# Patient Record
Sex: Male | Born: 1970 | Race: White | Hispanic: No | Marital: Single | Smoking: Never smoker
Health system: Southern US, Community
[De-identification: ages and names within clinical notes are randomized; demographics above are authoritative.]

## PROBLEM LIST (undated history)

## (undated) DIAGNOSIS — F329 Major depressive disorder, single episode, unspecified: Secondary | ICD-10-CM

## (undated) DIAGNOSIS — F32A Depression, unspecified: Secondary | ICD-10-CM

## (undated) DIAGNOSIS — I10 Essential (primary) hypertension: Secondary | ICD-10-CM

## (undated) DIAGNOSIS — G4700 Insomnia, unspecified: Secondary | ICD-10-CM

## (undated) HISTORY — DX: Major depressive disorder, single episode, unspecified: F32.9

## (undated) HISTORY — PX: APPENDECTOMY: SHX54

## (undated) HISTORY — DX: Depression, unspecified: F32.A

## (undated) HISTORY — DX: Essential (primary) hypertension: I10

## (undated) HISTORY — DX: Insomnia, unspecified: G47.00

---

## 2010-08-16 ENCOUNTER — Emergency Department (HOSPITAL_COMMUNITY): Payer: 59

## 2010-08-16 ENCOUNTER — Emergency Department (HOSPITAL_COMMUNITY)
Admission: EM | Admit: 2010-08-16 | Discharge: 2010-08-16 | Disposition: A | Discharge status: Home / Self Care | Payer: 59 | Attending: Emergency Medicine | Admitting: Emergency Medicine

## 2010-08-16 DIAGNOSIS — X58XXXA Exposure to other specified factors, initial encounter: Secondary | ICD-10-CM | POA: Insufficient documentation

## 2010-08-16 DIAGNOSIS — R55 Syncope and collapse: Secondary | ICD-10-CM | POA: Insufficient documentation

## 2010-08-16 DIAGNOSIS — IMO0002 Reserved for concepts with insufficient information to code with codable children: Secondary | ICD-10-CM | POA: Insufficient documentation

## 2010-08-16 DIAGNOSIS — I10 Essential (primary) hypertension: Secondary | ICD-10-CM | POA: Insufficient documentation

## 2010-08-16 DIAGNOSIS — Y92009 Unspecified place in unspecified non-institutional (private) residence as the place of occurrence of the external cause: Secondary | ICD-10-CM | POA: Insufficient documentation

## 2010-08-16 DIAGNOSIS — F1010 Alcohol abuse, uncomplicated: Secondary | ICD-10-CM | POA: Insufficient documentation

## 2010-08-16 LAB — POCT I-STAT, CHEM 8
BUN: 9 mg/dL (ref 6–23)
Chloride: 101 mEq/L (ref 96–112)
Creatinine, Ser: 1.7 mg/dL — ABNORMAL HIGH (ref 0.4–1.5)
Glucose, Bld: 120 mg/dL — ABNORMAL HIGH (ref 70–99)
Potassium: 4.9 mEq/L (ref 3.5–5.1)

## 2010-08-16 LAB — URINALYSIS, ROUTINE W REFLEX MICROSCOPIC
Bilirubin Urine: NEGATIVE
Ketones, ur: NEGATIVE mg/dL
Leukocytes, UA: NEGATIVE
Specific Gravity, Urine: 1.012 (ref 1.005–1.030)
Urine Glucose, Fasting: NEGATIVE mg/dL
pH: 6 (ref 5.0–8.0)

## 2010-08-16 LAB — CBC
HCT: 48.4 % (ref 39.0–52.0)
Hemoglobin: 16.6 g/dL (ref 13.0–17.0)
MCHC: 34.3 g/dL (ref 30.0–36.0)
RBC: 4.57 MIL/uL (ref 4.22–5.81)
WBC: 7.2 10*3/uL (ref 4.0–10.5)

## 2010-08-16 LAB — URINE MICROSCOPIC-ADD ON

## 2010-08-16 LAB — COMPREHENSIVE METABOLIC PANEL
ALT: 60 U/L — ABNORMAL HIGH (ref 0–53)
AST: 41 U/L — ABNORMAL HIGH (ref 0–37)
Calcium: 9 mg/dL (ref 8.4–10.5)
Creatinine, Ser: 1.24 mg/dL (ref 0.4–1.5)
GFR calc Af Amer: >60 mL/min (ref >60)
Sodium: 141 mEq/L (ref 135–145)
Total Protein: 7 g/dL (ref 6.0–8.3)

## 2010-08-16 LAB — DIFFERENTIAL
Basophils Absolute: 0 10*3/uL (ref 0.0–0.1)
Lymphocytes Relative: 17 % (ref 12–46)
Monocytes Absolute: 0.5 10*3/uL (ref 0.1–1.0)
Neutro Abs: 5.4 10*3/uL (ref 1.7–7.7)

## 2010-08-16 LAB — RAPID URINE DRUG SCREEN, HOSP PERFORMED
Cocaine: NOT DETECTED
Tetrahydrocannabinol: NOT DETECTED

## 2010-08-16 LAB — POCT CARDIAC MARKERS
CKMB, poc: <1 ng/mL — ABNORMAL LOW (ref 1.0–8.0)
Troponin i, poc: <0.05 ng/mL (ref 0.00–0.09)

## 2015-12-23 ENCOUNTER — Encounter: Payer: Self-pay | Admitting: Internal Medicine

## 2016-01-15 ENCOUNTER — Encounter (INDEPENDENT_AMBULATORY_CARE_PROVIDER_SITE_OTHER): Payer: Self-pay

## 2016-01-15 ENCOUNTER — Encounter: Payer: Self-pay | Admitting: Gastroenterology

## 2016-01-15 ENCOUNTER — Ambulatory Visit (INDEPENDENT_AMBULATORY_CARE_PROVIDER_SITE_OTHER): Payer: BLUE CROSS/BLUE SHIELD | Admitting: Gastroenterology

## 2016-01-15 VITALS — BP 117/75 | HR 59 | Temp 98.6°F | Ht 73.0 in | Wt 195.4 lb

## 2016-01-15 DIAGNOSIS — R7989 Other specified abnormal findings of blood chemistry: Secondary | ICD-10-CM | POA: Diagnosis not present

## 2016-01-15 DIAGNOSIS — R945 Abnormal results of liver function studies: Principal | ICD-10-CM

## 2016-01-15 LAB — FERRITIN: Ferritin: 2871 ng/mL — ABNORMAL HIGH (ref 20–380)

## 2016-01-15 LAB — PROTIME-INR
INR: 1
Prothrombin Time: 10.8 s (ref 9.0–11.5)

## 2016-01-15 LAB — IRON AND TIBC
%SAT: 54 % (ref 15–60)
IRON: 171 ug/dL (ref 50–180)
TIBC: 317 ug/dL (ref 250–425)
UIBC: 146 ug/dL (ref 125–400)

## 2016-01-15 NOTE — Assessment & Plan Note (Signed)
45 year old male with elevated transaminases in the setting of routine moderate ETOH use. Viral markers negative. Will need to pursue evaluation with ultrasound of abdomen and further serologies to rule out any other underlying etiology. Likely a fatty liver component +/- ETOH. Recommend ETOH abstinence. Further recommendations to follow.
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
## 2016-01-15 NOTE — Progress Notes (Signed)
CC'D TO PCP °

## 2016-01-15 NOTE — Patient Instructions (Signed)
Please completed blood work today.   I have ordered an ultrasound of your liver.   Please limit or abstain from alcohol.  Further recommendations to follow!

## 2016-01-16 LAB — MITOCHONDRIAL ANTIBODIES: Mitochondrial M2 Ab, IgG: <=20 Units (ref <=20.0)

## 2016-01-16 LAB — ANTI-SMOOTH MUSCLE ANTIBODY, IGG: Smooth Muscle Ab: 28 U — ABNORMAL HIGH (ref <20)

## 2016-01-16 LAB — ANA: ANA: NEGATIVE

## 2016-01-17 LAB — CERULOPLASMIN: Ceruloplasmin: 29 mg/dL (ref 18–36)

## 2016-01-20 ENCOUNTER — Ambulatory Visit (HOSPITAL_COMMUNITY)
Admission: RE | Admit: 2016-01-20 | Discharge: 2016-01-20 | Disposition: A | Discharge status: Home / Self Care | Payer: BLUE CROSS/BLUE SHIELD | Source: Ambulatory Visit | Attending: Gastroenterology | Admitting: Gastroenterology

## 2016-01-20 DIAGNOSIS — R933 Abnormal findings on diagnostic imaging of other parts of digestive tract: Secondary | ICD-10-CM | POA: Diagnosis not present

## 2016-01-20 DIAGNOSIS — R7989 Other specified abnormal findings of blood chemistry: Secondary | ICD-10-CM | POA: Insufficient documentation

## 2016-01-20 DIAGNOSIS — R945 Abnormal results of liver function studies: Secondary | ICD-10-CM

## 2016-01-22 NOTE — Progress Notes (Signed)
Quick Note:  US abdomen notes sludge within gallbladder but borderline wall thickening, probable fatty liver. He is asymptomatic. Ferritin markedly elevated, iron sats elevated more than expected. Could be due to alcohol but we need to check hemochromatosis labs. ASMA also positive. Very important that he avoids alcohol completely. He may end up with a liver biopsy. Let's check hemochromatosis labs now. I have a low threshold for liver biopsy. ______

## 2016-01-24 ENCOUNTER — Other Ambulatory Visit: Payer: Self-pay

## 2016-01-24 ENCOUNTER — Other Ambulatory Visit: Payer: Self-pay | Admitting: Gastroenterology

## 2016-01-24 DIAGNOSIS — R7989 Other specified abnormal findings of blood chemistry: Secondary | ICD-10-CM

## 2016-02-20 LAB — HEMOCHROMATOSIS DNA-PCR(C282Y,H63D)

## 2016-03-17 NOTE — Progress Notes (Signed)
Likely negative for hemochromatosis. With his markedly elevated ferritin and +ASMA, would recommend liver biopsy to further sort out. If he is willing, recommend a transjugular liver biopsy with wedge measurements to assess for portal hypertension. Then, have him follow-up with me.

## 2016-03-23 ENCOUNTER — Encounter: Payer: Self-pay | Admitting: Internal Medicine

## 2016-03-23 NOTE — Progress Notes (Signed)
Noted. Would offer him routine office visit for follow-up.

## 2016-03-23 NOTE — Progress Notes (Signed)
APPT MADE AND LETTER SENT  °

## 2016-04-23 ENCOUNTER — Ambulatory Visit: Payer: BLUE CROSS/BLUE SHIELD | Admitting: Gastroenterology

## 2019-03-17 ENCOUNTER — Emergency Department (HOSPITAL_COMMUNITY): Payer: Self-pay

## 2019-03-17 ENCOUNTER — Encounter (HOSPITAL_COMMUNITY): Payer: Self-pay | Admitting: Emergency Medicine

## 2019-03-17 ENCOUNTER — Inpatient Hospital Stay (HOSPITAL_COMMUNITY)
Admission: EM | Admit: 2019-03-17 | Discharge: 2019-03-31 | DRG: 918 | Disposition: A | Discharge status: Home / Self Care | Payer: Self-pay | Attending: Internal Medicine | Admitting: Internal Medicine

## 2019-03-17 ENCOUNTER — Other Ambulatory Visit: Payer: Self-pay

## 2019-03-17 DIAGNOSIS — W19XXXA Unspecified fall, initial encounter: Secondary | ICD-10-CM

## 2019-03-17 DIAGNOSIS — Z915 Personal history of self-harm: Secondary | ICD-10-CM

## 2019-03-17 DIAGNOSIS — R292 Abnormal reflex: Secondary | ICD-10-CM | POA: Diagnosis present

## 2019-03-17 DIAGNOSIS — F332 Major depressive disorder, recurrent severe without psychotic features: Secondary | ICD-10-CM | POA: Insufficient documentation

## 2019-03-17 DIAGNOSIS — M4802 Spinal stenosis, cervical region: Secondary | ICD-10-CM | POA: Diagnosis present

## 2019-03-17 DIAGNOSIS — F331 Major depressive disorder, recurrent, moderate: Secondary | ICD-10-CM

## 2019-03-17 DIAGNOSIS — M6282 Rhabdomyolysis: Secondary | ICD-10-CM | POA: Diagnosis present

## 2019-03-17 DIAGNOSIS — H18603 Keratoconus, unspecified, bilateral: Secondary | ICD-10-CM | POA: Diagnosis present

## 2019-03-17 DIAGNOSIS — F1020 Alcohol dependence, uncomplicated: Secondary | ICD-10-CM

## 2019-03-17 DIAGNOSIS — Z7289 Other problems related to lifestyle: Secondary | ICD-10-CM

## 2019-03-17 DIAGNOSIS — S0010XA Contusion of unspecified eyelid and periocular area, initial encounter: Secondary | ICD-10-CM | POA: Diagnosis present

## 2019-03-17 DIAGNOSIS — S46812A Strain of other muscles, fascia and tendons at shoulder and upper arm level, left arm, initial encounter: Secondary | ICD-10-CM | POA: Diagnosis present

## 2019-03-17 DIAGNOSIS — W1812XA Fall from or off toilet with subsequent striking against object, initial encounter: Secondary | ICD-10-CM | POA: Diagnosis present

## 2019-03-17 DIAGNOSIS — Z20828 Contact with and (suspected) exposure to other viral communicable diseases: Secondary | ICD-10-CM | POA: Diagnosis present

## 2019-03-17 DIAGNOSIS — R778 Other specified abnormalities of plasma proteins: Secondary | ICD-10-CM

## 2019-03-17 DIAGNOSIS — H18891 Other specified disorders of cornea, right eye: Secondary | ICD-10-CM | POA: Diagnosis present

## 2019-03-17 DIAGNOSIS — D696 Thrombocytopenia, unspecified: Secondary | ICD-10-CM | POA: Diagnosis present

## 2019-03-17 DIAGNOSIS — Z560 Unemployment, unspecified: Secondary | ICD-10-CM

## 2019-03-17 DIAGNOSIS — Z653 Problems related to other legal circumstances: Secondary | ICD-10-CM

## 2019-03-17 DIAGNOSIS — Z79899 Other long term (current) drug therapy: Secondary | ICD-10-CM

## 2019-03-17 DIAGNOSIS — Z960 Presence of urogenital implants: Secondary | ICD-10-CM

## 2019-03-17 DIAGNOSIS — G4700 Insomnia, unspecified: Secondary | ICD-10-CM

## 2019-03-17 DIAGNOSIS — F339 Major depressive disorder, recurrent, unspecified: Secondary | ICD-10-CM

## 2019-03-17 DIAGNOSIS — G8324 Monoplegia of upper limb affecting left nondominant side: Secondary | ICD-10-CM | POA: Diagnosis present

## 2019-03-17 DIAGNOSIS — M549 Dorsalgia, unspecified: Secondary | ICD-10-CM

## 2019-03-17 DIAGNOSIS — M4712 Other spondylosis with myelopathy, cervical region: Secondary | ICD-10-CM | POA: Diagnosis present

## 2019-03-17 DIAGNOSIS — R509 Fever, unspecified: Secondary | ICD-10-CM

## 2019-03-17 DIAGNOSIS — T510X2A Toxic effect of ethanol, intentional self-harm, initial encounter: Secondary | ICD-10-CM | POA: Diagnosis present

## 2019-03-17 DIAGNOSIS — Z91048 Other nonmedicinal substance allergy status: Secondary | ICD-10-CM

## 2019-03-17 DIAGNOSIS — T43212A Poisoning by selective serotonin and norepinephrine reuptake inhibitors, intentional self-harm, initial encounter: Principal | ICD-10-CM | POA: Diagnosis present

## 2019-03-17 DIAGNOSIS — K5900 Constipation, unspecified: Secondary | ICD-10-CM | POA: Diagnosis present

## 2019-03-17 DIAGNOSIS — M5412 Radiculopathy, cervical region: Secondary | ICD-10-CM | POA: Diagnosis present

## 2019-03-17 DIAGNOSIS — H0289 Other specified disorders of eyelid: Secondary | ICD-10-CM

## 2019-03-17 DIAGNOSIS — T796XXA Traumatic ischemia of muscle, initial encounter: Secondary | ICD-10-CM

## 2019-03-17 DIAGNOSIS — M25412 Effusion, left shoulder: Secondary | ICD-10-CM | POA: Diagnosis present

## 2019-03-17 DIAGNOSIS — H1131 Conjunctival hemorrhage, right eye: Secondary | ICD-10-CM

## 2019-03-17 DIAGNOSIS — R55 Syncope and collapse: Secondary | ICD-10-CM

## 2019-03-17 DIAGNOSIS — R45851 Suicidal ideations: Secondary | ICD-10-CM | POA: Diagnosis present

## 2019-03-17 DIAGNOSIS — T1491XA Suicide attempt, initial encounter: Secondary | ICD-10-CM | POA: Diagnosis present

## 2019-03-17 DIAGNOSIS — Y92002 Bathroom of unspecified non-institutional (private) residence single-family (private) house as the place of occurrence of the external cause: Secondary | ICD-10-CM

## 2019-03-17 DIAGNOSIS — K760 Fatty (change of) liver, not elsewhere classified: Secondary | ICD-10-CM | POA: Diagnosis present

## 2019-03-17 DIAGNOSIS — S14129A Central cord syndrome at unspecified level of cervical spinal cord, initial encounter: Secondary | ICD-10-CM | POA: Diagnosis present

## 2019-03-17 DIAGNOSIS — S0511XA Contusion of eyeball and orbital tissues, right eye, initial encounter: Secondary | ICD-10-CM | POA: Diagnosis present

## 2019-03-17 DIAGNOSIS — I10 Essential (primary) hypertension: Secondary | ICD-10-CM

## 2019-03-17 DIAGNOSIS — R6510 Systemic inflammatory response syndrome (SIRS) of non-infectious origin without acute organ dysfunction: Secondary | ICD-10-CM

## 2019-03-17 DIAGNOSIS — N179 Acute kidney failure, unspecified: Secondary | ICD-10-CM | POA: Diagnosis present

## 2019-03-17 LAB — COMPREHENSIVE METABOLIC PANEL
ALT: 52 U/L — ABNORMAL HIGH (ref 0–44)
AST: 207 U/L — ABNORMAL HIGH (ref 15–41)
Albumin: 3.3 g/dL — ABNORMAL LOW (ref 3.5–5.0)
Alkaline Phosphatase: 39 U/L (ref 38–126)
Anion gap: 18 — ABNORMAL HIGH (ref 5–15)
BUN: 46 mg/dL — ABNORMAL HIGH (ref 6–20)
CO2: 25 mmol/L (ref 22–32)
Calcium: 8.6 mg/dL — ABNORMAL LOW (ref 8.9–10.3)
Chloride: 101 mmol/L (ref 98–111)
Creatinine, Ser: 2.38 mg/dL — ABNORMAL HIGH (ref 0.61–1.24)
GFR calc Af Amer: 37 mL/min — ABNORMAL LOW (ref >60)
GFR calc non Af Amer: 31 mL/min — ABNORMAL LOW (ref >60)
Glucose, Bld: 147 mg/dL — ABNORMAL HIGH (ref 70–99)
Potassium: 3.9 mmol/L (ref 3.5–5.1)
Sodium: 144 mmol/L (ref 135–145)
Total Bilirubin: 3.6 mg/dL — ABNORMAL HIGH (ref 0.3–1.2)
Total Protein: 6.8 g/dL (ref 6.5–8.1)

## 2019-03-17 LAB — I-STAT CHEM 8, ED
BUN: 50 mg/dL — ABNORMAL HIGH (ref 6–20)
Calcium, Ion: 1.01 mmol/L — ABNORMAL LOW (ref 1.15–1.40)
Chloride: 105 mmol/L (ref 98–111)
Creatinine, Ser: 2 mg/dL — ABNORMAL HIGH (ref 0.61–1.24)
Glucose, Bld: 138 mg/dL — ABNORMAL HIGH (ref 70–99)
HCT: 52 % (ref 39.0–52.0)
Hemoglobin: 17.7 g/dL — ABNORMAL HIGH (ref 13.0–17.0)
Potassium: 3.9 mmol/L (ref 3.5–5.1)
Sodium: 143 mmol/L (ref 135–145)
TCO2: 26 mmol/L (ref 22–32)

## 2019-03-17 LAB — SARS CORONAVIRUS 2 BY RT PCR (HOSPITAL ORDER, PERFORMED IN ~~LOC~~ HOSPITAL LAB): SARS Coronavirus 2: NEGATIVE

## 2019-03-17 LAB — URINALYSIS, ROUTINE W REFLEX MICROSCOPIC
Bilirubin Urine: NEGATIVE
Glucose, UA: NEGATIVE mg/dL
Ketones, ur: 20 mg/dL — AB
Leukocytes,Ua: NEGATIVE
Nitrite: NEGATIVE
Protein, ur: 100 mg/dL — AB
Specific Gravity, Urine: 1.027 (ref 1.005–1.030)
pH: 5 (ref 5.0–8.0)

## 2019-03-17 LAB — BASIC METABOLIC PANEL
Anion gap: 10 (ref 5–15)
BUN: 38 mg/dL — ABNORMAL HIGH (ref 6–20)
CO2: 21 mmol/L — ABNORMAL LOW (ref 22–32)
Calcium: 6.5 mg/dL — ABNORMAL LOW (ref 8.9–10.3)
Chloride: 114 mmol/L — ABNORMAL HIGH (ref 98–111)
Creatinine, Ser: 1.59 mg/dL — ABNORMAL HIGH (ref 0.61–1.24)
GFR calc Af Amer: 59 mL/min — ABNORMAL LOW (ref >60)
GFR calc non Af Amer: 51 mL/min — ABNORMAL LOW (ref >60)
Glucose, Bld: 115 mg/dL — ABNORMAL HIGH (ref 70–99)
Potassium: 3.1 mmol/L — ABNORMAL LOW (ref 3.5–5.1)
Sodium: 145 mmol/L (ref 135–145)

## 2019-03-17 LAB — TYPE AND SCREEN
ABO/RH(D): A POS
Antibody Screen: NEGATIVE

## 2019-03-17 LAB — POC OCCULT BLOOD, ED: Fecal Occult Bld: NEGATIVE

## 2019-03-17 LAB — PROTIME-INR
INR: 1 (ref 0.8–1.2)
INR: 1.3 — ABNORMAL HIGH (ref 0.8–1.2)
Prothrombin Time: 13.4 seconds (ref 11.4–15.2)
Prothrombin Time: 15.7 seconds — ABNORMAL HIGH (ref 11.4–15.2)

## 2019-03-17 LAB — CBC
HCT: 51.7 % (ref 39.0–52.0)
Hemoglobin: 17.7 g/dL — ABNORMAL HIGH (ref 13.0–17.0)
MCH: 38.7 pg — ABNORMAL HIGH (ref 26.0–34.0)
MCHC: 34.2 g/dL (ref 30.0–36.0)
MCV: 113.1 fL — ABNORMAL HIGH (ref 80.0–100.0)
Platelets: 65 10*3/uL — ABNORMAL LOW (ref 150–400)
RBC: 4.57 MIL/uL (ref 4.22–5.81)
RDW: 13 % (ref 11.5–15.5)
WBC: 7 10*3/uL (ref 4.0–10.5)
nRBC: 0 % (ref 0.0–0.2)

## 2019-03-17 LAB — APTT
aPTT: 24 seconds (ref 24–36)
aPTT: 24 seconds (ref 24–36)

## 2019-03-17 LAB — ABO/RH: ABO/RH(D): A POS

## 2019-03-17 LAB — MAGNESIUM: Magnesium: 1.9 mg/dL (ref 1.7–2.4)

## 2019-03-17 LAB — CK TOTAL AND CKMB (NOT AT ARMC)
CK, MB: 13.6 ng/mL — ABNORMAL HIGH (ref 0.5–5.0)
Relative Index: 0.2 (ref 0.0–2.5)
Total CK: 8252 U/L — ABNORMAL HIGH (ref 49–397)

## 2019-03-17 LAB — TROPONIN I (HIGH SENSITIVITY)
Troponin I (High Sensitivity): 91 ng/L — ABNORMAL HIGH (ref <18)
Troponin I (High Sensitivity): 85 ng/L — ABNORMAL HIGH (ref <18)

## 2019-03-17 LAB — CDS SEROLOGY

## 2019-03-17 LAB — PHOSPHORUS: Phosphorus: 3.3 mg/dL (ref 2.5–4.6)

## 2019-03-17 LAB — LACTIC ACID, PLASMA
Lactic Acid, Venous: 2.8 mmol/L (ref 0.5–1.9)
Lactic Acid, Venous: 1.4 mmol/L (ref 0.5–1.9)

## 2019-03-17 LAB — ETHANOL: Alcohol, Ethyl (B): <10 mg/dL (ref <10)

## 2019-03-17 MED ORDER — TETANUS-DIPHTH-ACELL PERTUSSIS 5-2.5-18.5 LF-MCG/0.5 IM SUSP
0.5000 mL | Freq: Once | INTRAMUSCULAR | Status: AC
  Administered 2019-03-17: 16:00:00 0.5 mL via INTRAMUSCULAR
  Filled 2019-03-17: qty 0.5

## 2019-03-17 MED ORDER — AMLODIPINE BESYLATE 5 MG PO TABS
10.0000 mg | ORAL_TABLET | Freq: Every day | ORAL | Status: DC
  Administered 2019-03-17: 19:00:00 10 mg via ORAL
  Filled 2019-03-17: qty 2

## 2019-03-17 MED ORDER — SODIUM CHLORIDE 0.9 % IV BOLUS
1000.0000 mL | Freq: Once | INTRAVENOUS | Status: AC
  Administered 2019-03-17: 16:00:00 1000 mL via INTRAVENOUS

## 2019-03-17 MED ORDER — SODIUM CHLORIDE 0.9 % IV SOLN
2.0000 g | Freq: Once | INTRAVENOUS | Status: AC
  Administered 2019-03-17: 16:00:00 2 g via INTRAVENOUS
  Filled 2019-03-17: qty 2

## 2019-03-17 MED ORDER — LORAZEPAM 2 MG/ML IJ SOLN: 0.0000 mg | Freq: Four times a day (QID) | INTRAMUSCULAR | Status: DC

## 2019-03-17 MED ORDER — VANCOMYCIN HCL 10 G IV SOLR: 1500.0000 mg | INTRAVENOUS | Status: DC

## 2019-03-17 MED ORDER — VITAMIN B-1 100 MG PO TABS
100.0000 mg | ORAL_TABLET | Freq: Every day | ORAL | Status: DC
  Administered 2019-03-17 – 2019-03-31 (×15): 100 mg via ORAL
  Filled 2019-03-17 (×15): qty 1

## 2019-03-17 MED ORDER — IOHEXOL 300 MG/ML  SOLN
80.0000 mL | Freq: Once | INTRAMUSCULAR | Status: AC | PRN
  Administered 2019-03-17: 80 mL via INTRAVENOUS

## 2019-03-17 MED ORDER — AMLODIPINE BESY-BENAZEPRIL HCL 10-40 MG PO CAPS: 1.0000 | ORAL_CAPSULE | Freq: Every day | ORAL | Status: DC

## 2019-03-17 MED ORDER — FOLIC ACID 1 MG PO TABS
1.0000 mg | ORAL_TABLET | Freq: Every day | ORAL | Status: DC
  Administered 2019-03-17 – 2019-03-31 (×15): 1 mg via ORAL
  Filled 2019-03-17 (×15): qty 1

## 2019-03-17 MED ORDER — VANCOMYCIN HCL 10 G IV SOLR
2500.0000 mg | Freq: Once | INTRAVENOUS | Status: AC
  Administered 2019-03-17: 2500 mg via INTRAVENOUS
  Filled 2019-03-17: qty 2500

## 2019-03-17 MED ORDER — METRONIDAZOLE IN NACL 5-0.79 MG/ML-% IV SOLN
500.0000 mg | Freq: Once | INTRAVENOUS | Status: AC
  Administered 2019-03-17: 500 mg via INTRAVENOUS
  Filled 2019-03-17: qty 100

## 2019-03-17 MED ORDER — PROPRANOLOL HCL ER BEADS 80 MG PO CP24: 80.0000 mg | ORAL_CAPSULE | Freq: Every day | ORAL | Status: DC

## 2019-03-17 MED ORDER — LABETALOL HCL 5 MG/ML IV SOLN: 5.0000 mg | Freq: Once | INTRAVENOUS | Status: DC | PRN

## 2019-03-17 MED ORDER — LORAZEPAM 1 MG PO TABS: 1.0000 mg | ORAL_TABLET | Freq: Four times a day (QID) | ORAL | Status: AC | PRN

## 2019-03-17 MED ORDER — LORAZEPAM 2 MG/ML IJ SOLN
0.5000 mg | Freq: Once | INTRAMUSCULAR | Status: AC
  Administered 2019-03-17: 19:00:00 0.5 mg via INTRAVENOUS
  Filled 2019-03-17: qty 1

## 2019-03-17 MED ORDER — SODIUM CHLORIDE 0.9 % IV BOLUS
1000.0000 mL | Freq: Once | INTRAVENOUS | Status: AC
  Administered 2019-03-17: 1000 mL via INTRAVENOUS

## 2019-03-17 MED ORDER — VANCOMYCIN HCL IN DEXTROSE 1-5 GM/200ML-% IV SOLN: 1000.0000 mg | Freq: Once | INTRAVENOUS | Status: DC

## 2019-03-17 MED ORDER — SODIUM CHLORIDE 0.9 % IV SOLN
INTRAVENOUS | Status: DC
  Administered 2019-03-17 – 2019-03-22 (×18): via INTRAVENOUS

## 2019-03-17 MED ORDER — THIAMINE HCL 100 MG/ML IJ SOLN
100.0000 mg | Freq: Every day | INTRAMUSCULAR | Status: DC
  Administered 2019-03-17: 100 mg via INTRAVENOUS
  Filled 2019-03-17: qty 2

## 2019-03-17 MED ORDER — LORAZEPAM 2 MG/ML IJ SOLN: 0.0000 mg | Freq: Two times a day (BID) | INTRAMUSCULAR | Status: DC

## 2019-03-17 MED ORDER — VITAMIN B-1 100 MG PO TABS: 100.0000 mg | ORAL_TABLET | Freq: Every day | ORAL | Status: DC

## 2019-03-17 MED ORDER — LORAZEPAM 2 MG/ML IJ SOLN
1.0000 mg | Freq: Four times a day (QID) | INTRAMUSCULAR | Status: AC | PRN
  Administered 2019-03-20: 15:00:00 1 mg via INTRAVENOUS
  Filled 2019-03-17: qty 1

## 2019-03-17 MED ORDER — LORAZEPAM 1 MG PO TABS: 0.0000 mg | ORAL_TABLET | Freq: Four times a day (QID) | ORAL | Status: DC

## 2019-03-17 MED ORDER — THIAMINE HCL 100 MG/ML IJ SOLN: 100.0000 mg | Freq: Every day | INTRAMUSCULAR | Status: DC

## 2019-03-17 MED ORDER — AMLODIPINE BESYLATE 5 MG PO TABS: 5.0000 mg | ORAL_TABLET | Freq: Every day | ORAL | Status: DC

## 2019-03-17 MED ORDER — ADULT MULTIVITAMIN W/MINERALS CH
1.0000 | ORAL_TABLET | Freq: Every day | ORAL | Status: DC
  Administered 2019-03-17 – 2019-03-31 (×15): 1 via ORAL
  Filled 2019-03-17 (×15): qty 1

## 2019-03-17 MED ORDER — THIAMINE HCL 100 MG/ML IJ SOLN
100.0000 mg | Freq: Every day | INTRAMUSCULAR | Status: DC
  Filled 2019-03-17: qty 2

## 2019-03-17 MED ORDER — LORAZEPAM 1 MG PO TABS: 0.0000 mg | ORAL_TABLET | Freq: Two times a day (BID) | ORAL | Status: DC

## 2019-03-17 MED ORDER — SODIUM CHLORIDE 0.9 % IV SOLN: 2.0000 g | Freq: Two times a day (BID) | INTRAVENOUS | Status: DC
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
## 2019-03-17 NOTE — Progress Notes (Signed)
Attempted to get report nurse not available.

## 2019-03-17 NOTE — Progress Notes (Signed)
Pharmacy Antibiotic Note  Clayton Baldwin is a 48 y.o. male admitted on 03/17/2019 with sepsis.  Pharmacy has been consulted for vancomycin/cefepime dosing. Tmax 100.4, WBC wnl, LA 2.8. SCr 2.38 on admit.  Plan: Cefepime 2g IV x 1; then 2g IV q12h Vancomycin 2500mg  IV x1; then Vancomycin 1500 mg IV Q 24 hrs. Goal AUC 400-550. Expected AUC: 538 SCr used: 2.38 Flagyl 500mg  IV x 1 per EDP - f/u if to continue Monitor clinical progress, c/s, renal function F/u de-escalation plan/LOT, vancomycin levels as indicated   Height: 6' (182.9 cm) Weight: 215 lb (97.5 kg) IBW/kg (Calculated) : 77.6  Temp (24hrs), Avg:100.4 F (38 C), Min:100.4 F (38 C), Max:100.4 F (38 C)  No results for input(s): WBC, CREATININE, LATICACIDVEN, VANCOTROUGH, VANCOPEAK, VANCORANDOM, GENTTROUGH, GENTPEAK, GENTRANDOM, TOBRATROUGH, TOBRAPEAK, TOBRARND, AMIKACINPEAK, AMIKACINTROU, AMIKACIN in the last 168 hours.  CrCl cannot be calculated (Patient's most recent lab result is older than the maximum 21 days allowed.).    No Known Allergies  Antimicrobials this admission: 9/4 vancomycin >>  9/4 cefepime >>  9/4 flagyl x 1  Dose adjustments this admission:   Microbiology results:   Elicia Lamp, PharmD, BCPS Clinical Pharmacist 03/17/2019 3:37 PM

## 2019-03-17 NOTE — ED Notes (Signed)
Attempted report, 4E refused

## 2019-03-17 NOTE — ED Notes (Signed)
Patient transported to X-ray 

## 2019-03-17 NOTE — ED Provider Notes (Signed)
[facility] EMERGENCY DEPARTMENT Provider Note   CSN: [id number] Arrival date & time: 03/17/19  1513   History   Chief Complaint Chief Complaint  Patient presents with   Fall   HPI Clayton Baldwin is a 48 y.o. male with past medical significant for hypertension, depression who presents via EMS.  She states he has had diarrhea x1 week.  Patient states he was having a bowel movement on Tuesday when he fell forward.  He does not know if he had a syncopal event.  Patient states he was too weak to get up.  Family had not heard from him and called police for a welfare check.  Please found patient on floor in the prone position.  Patient was to generalized weakness, productive cough and chills over the last week.  No recent antibiotic use no recent travel.  No known COVID exposures.  Patient states he has noted some dark stools without bright red blood per rectum.  He does take NSAIDs approximately 1 every other day for chronic headaches.  Patient denies sudden onset thunderclap headache.  Patient admits to pain "everywhere.  He has multiple contusions and abrasions.  Its to abdominal pain which he described as cramping.  No prior history of CVA.  Denies alcohol use or drug use.  Denies anticoagulation.  History otained from patient, past medical records.  No interpreter was used.     HPI  Past Medical History:  Diagnosis Date   Depression    Hypertension    Insomnia     Patient Active Problem List   Diagnosis Date Noted   Elevated LFTs 01/15/2016    Past Surgical History:  Procedure Laterality Date   APPENDECTOMY          Home Medications    Prior to Admission medications   Medication Sig Start Date End Date Taking? Authorizing Provider  cetirizine (KLS ALLER-TEC) 10 MG tablet Take 10 mg by mouth daily.   Yes [provider]  ibuprofen (ADVIL) 200 MG tablet Take 400 mg by mouth every 6 (six) hours as needed for headache or mild pain.   Yes  [provider]  NON FORMULARY Take 1 capsule by mouth See admin instructions. [employer] [employer] Adult 50+ Softgels- Take 1 softgel by mouth once a day   Yes [provider]  traZODone (DESYREL) 150 MG tablet Take 150 mg by mouth at bedtime as needed for sleep.    Yes [provider]  allopurinol (ZYLOPRIM) 100 MG tablet Take 100 mg by mouth daily.    [provider]  amLODipine (NORVASC) 10 MG tablet Take 10 mg by mouth daily.    [provider]  escitalopram (LEXAPRO) 20 MG tablet Take 20 mg by mouth daily.    [provider]  propranolol (INNOPRAN XL) 80 MG 24 hr capsule Take 80 mg by mouth at bedtime.    [provider]    Family History Family History  Problem Relation Age of Onset   Liver disease Other    Colon cancer Neg Hx    Colon polyps Neg Hx     Social History Social History   Tobacco Use   Smoking status: Never Smoker   Tobacco comment: Never really smoked  Substance Use Topics   Alcohol use: Yes    Alcohol/week: 0.0 standard drinks    Comment: couple of beers about 4 times per week    Drug use: No     Allergies   Grass  extracts [gramineae pollens]   Review of Systems Review of Systems  Constitutional: Positive for activity change, appetite change and chills.  HENT: Negative.   Respiratory: Positive for cough and shortness of breath (intermittent). Negative for apnea, choking, chest tightness, wheezing and stridor.   Cardiovascular: Negative.   Gastrointestinal: Positive for abdominal pain, blood in stool, diarrhea, nausea and vomiting. Negative for anal bleeding, constipation and rectal pain.  Genitourinary: Negative.   Musculoskeletal: Positive for gait problem.  Skin: Positive for wound.  Neurological: Positive for tremors, weakness, light-headedness and headaches (Chronic). Negative for dizziness, syncope, facial asymmetry, speech difficulty and numbness.  All other systems  reviewed and are negative.   Physical Exam Updated Vital Signs BP (!) 171/109    Pulse 95    Temp 98 F (36.7 C) (Oral)    Resp 17    Ht 6' (1.829 m)    Wt 97.5 kg    SpO2 99%    BMI 29.16 kg/m   Physical Exam Vitals signs and nursing note reviewed. Exam conducted with a chaperone present.  Constitutional:      General: He is not in acute distress.    Appearance: He is well-developed. He is ill-appearing. He is not diaphoretic.  HENT:     Head: Abrasion present.     Jaw: There is normal jaw occlusion.      Comments: Hematoma to left superior surface of her overall ridge to right eye.  No lacerations.  Mild contusion.    Nose: Nose normal.     Mouth/Throat:     Comments: Mucous membranes very dry.  Patient with matted secretions to face and inside mouth.  Midline tongue.  No oral lesions. Eyes:     Pupils: Pupils are equal, round, and reactive to light.     Comments: Unable to open right eye. Injected sclera to left.  Crusted drainage, matting to bilateral eyelashes.  Pupils equal reactive to light to left. Unable to assess right eye secondary to swelling.  Neck:     Musculoskeletal: Normal range of motion and neck supple.     Comments: C-collar in place. Cardiovascular:     Rate and Rhythm: Normal rate and regular rhythm.     Pulses: Normal pulses.          Radial pulses are 2+ on the right side and 2+ on the left side.       Dorsalis pedis pulses are 2+ on the right side and 2+ on the left side.       Posterior tibial pulses are 2+ on the right side and 2+ on the left side.     Comments: No murmurs, rubs or gallops. Pulmonary:     Effort: Pulmonary effort is normal. No respiratory distress.     Breath sounds: Normal breath sounds and air entry.  Chest:     Comments: No crepitus, edema or step-offs. Abdominal:     General: Bowel sounds are normal. There is no distension.     Palpations: Abdomen is soft.     Tenderness: There is generalized abdominal tenderness.      Comments: Generalized tenderness palpation.  Patient with 2 overlying abrasions to upper abdominal wall.  Genitourinary:    Penis: Normal.   Musculoskeletal: Normal range of motion.     Comments: Minimally able to lift bilateral upper and lower extremities off the ground.  Multiple contusions or abrasions with skin tears to bilateral upper and lower extremities.  2+ DP, PT pulses bilaterally.  2+  radial pulses bilaterally.  Mild swelling to left elbow with full range of motion.  bilateral forearms nontender.  Wrist nontender and stable to palpation.  No shortening or rotation of legs.  No bony tenderness to humerus, forearms, wrists, femur, tib-fib.  No sick tenderness palpation or step-offs.  There is mild tenderness palpation to L3 however no crepitus, deformity or step-offs.  Skin:    General: Skin is warm and dry.     Comments: Contusions, abrasions of bilateral upper and lower extremities as well as to upper abdomen.  Neurological:     Mental Status: He is alert.     Comments: Patient alert and oriented person, place time and president. Cranial nerves II through XII grossly intact.    ED Treatments / Results  Labs (all labs ordered are listed, but only abnormal results are displayed) Labs Reviewed  COMPREHENSIVE METABOLIC PANEL - Abnormal; Notable for the following components:      Result Value   Glucose, Bld 147 (*)    BUN 46 (*)    Creatinine, Ser 2.38 (*)    Calcium 8.6 (*)    Albumin 3.3 (*)    AST 207 (*)    ALT 52 (*)    Total Bilirubin 3.6 (*)    GFR calc non Af Amer 31 (*)    GFR calc Af Amer 37 (*)    Anion gap 18 (*)    All other components within normal limits  CBC - Abnormal; Notable for the following components:   Hemoglobin 17.7 (*)    MCV 113.1 (*)    MCH 38.7 (*)    Platelets 65 (*)    All other components within normal limits  URINALYSIS, ROUTINE W REFLEX MICROSCOPIC - Abnormal; Notable for the following components:   Color, Urine AMBER (*)    APPearance  CLOUDY (*)    Hgb urine dipstick LARGE (*)    Ketones, ur 20 (*)    Protein, ur 100 (*)    Bacteria, UA RARE (*)    All other components within normal limits  LACTIC ACID, PLASMA - Abnormal; Notable for the following components:   Lactic Acid, Venous 2.8 (*)    All other components within normal limits  CK TOTAL AND CKMB (NOT AT [facility]) - Abnormal; Notable for the following components:   Total CK 8,252 (*)    CK, MB 13.6 (*)    All other components within normal limits  I-STAT CHEM 8, ED - Abnormal; Notable for the following components:   BUN 50 (*)    Creatinine, Ser 2.00 (*)    Glucose, Bld 138 (*)    Calcium, Ion 1.01 (*)    Hemoglobin 17.7 (*)    All other components within normal limits  TROPONIN I (HIGH SENSITIVITY) - Abnormal; Notable for the following components:   Troponin I (High Sensitivity) 91 (*)    All other components within normal limits  TROPONIN I (HIGH SENSITIVITY) - Abnormal; Notable for the following components:   Troponin I (High Sensitivity) 85 (*)    All other components within normal limits  CULTURE, BLOOD (ROUTINE X 2)  SARS CORONAVIRUS 2 (HOSPITAL ORDER, PERFORMED IN [facility])  CULTURE, BLOOD (ROUTINE X 2)  URINE CULTURE  ETHANOL  PROTIME-INR  APTT  CDS SEROLOGY  RAPID URINE DRUG SCREEN, HOSP PERFORMED  I-STAT CHEM 8, ED  POC OCCULT BLOOD, ED  TYPE AND SCREEN  ABO/RH    EKG EKG Interpretation  Date/Time:  Friday March 17 2019 16:42:37 EDT Ventricular Rate:  101 PR Interval:    QRS Duration: 76 QT Interval:  374 QTC Calculation: 485 R Axis:   48 Text Interpretation:  Sinus tachycardia Nonspecific T abnormalities, diffuse leads Borderline prolonged QT interval Confirmed by Kristine Royal [id number]) on 03/17/2019 5:45:34 PM   Radiology Dg Chest 1 View  Result Date: 03/17/2019 CLINICAL DATA:  Fall EXAM: CHEST  1 VIEW COMPARISON:  08/16/2010 FINDINGS: The heart size and mediastinal contours are within normal limits. Both lungs  are clear. The visualized skeletal structures are unremarkable. IMPRESSION: No active disease. Electronically Signed   By: Jasmine Pang M.D.   On: 03/17/2019 16:33   Dg Pelvis 1-2 Views  Result Date: 03/17/2019 CLINICAL DATA:  Fall EXAM: PELVIS - 1-2 VIEW COMPARISON:  None. FINDINGS: There is no evidence of pelvic fracture or diastasis. No pelvic bone lesions are seen. IMPRESSION: Negative. Electronically Signed   By: Jasmine Pang M.D.   On: 03/17/2019 16:33   Dg Elbow Complete Left  Result Date: 03/17/2019 CLINICAL DATA:  Fall with elbow abrasion EXAM: LEFT ELBOW - COMPLETE 3+ VIEW COMPARISON:  None. FINDINGS: No fracture or malalignment. No significant elbow effusion. Protuberant soft tissue swelling dorsal aspect of the proximal forearm. IMPRESSION: No acute osseous abnormality Electronically Signed   By: Jasmine Pang M.D.   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 Electronically Signed   By: Katherine Mantle M.D.   On: 03/17/2019 18:08   Result Date: 03/17/2019 CLINICAL DATA:  Acute pain due to trauma. Hematoma to the right eye. EXAM: CT HEAD WITHOUT CONTRAST CT MAXILLOFACIAL WITHOUT CONTRAST CT CERVICAL SPINE WITHOUT CONTRAST CT CHEST, ABDOMEN AND PELVIS WITH CONTRAST TECHNIQUE: Contiguous axial images were obtained from the base of the skull through the vertex without intravenous contrast. Multidetector CT imaging of the maxillofacial structures was performed. Multiplanar CT image reconstructions were also generated. A small metallic BB was placed on the right temple in order to reliably differentiate right from left. Multidetector CT imaging of the cervical spine was performed without intravenous contrast. Multiplanar CT image reconstructions were also generated. Multidetector CT imaging of the chest, abdomen and  pelvis was performed following the standard protocol during bolus administration of intravenous contrast. CONTRAST:  80mL OMNIPAQUE IOHEXOL 300 MG/ML  SOLN COMPARISON:  CT head dated 08/16/2010 FINDINGS: CT HEAD: Brain: No evidence of acute infarction, hemorrhage, hydrocephalus, extra-axial collection or mass lesion/mass effect. There is advanced atrophy, greater than expected for the patient's stated age. Vascular: No hyperdense vessel or unexpected calcification. Skull: Normal. Negative for fracture or focal lesion. There is right frontal scalp swelling. Other: None. CT MAXILLOFACIAL FINDINGS Osseous: No fracture or mandibular dislocation. No destructive process. Orbits: Negative. No traumatic or inflammatory finding. Sinuses: Clear. Soft tissues: There is right periorbital soft tissue swelling. CT CERVICAL SPINE FINDINGS Alignment: Normal. Skull base and vertebrae: No acute fracture. No primary bone lesion or focal pathologic process. 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 There are multilevel degenerative changes throughout the cervical spine, greatest at the C5-C6 level. There is osseous neural foraminal narrowing at the C4-C5 and C5-C6 levels bilaterally. Soft tissues and spinal canal: No prevertebral fluid or swelling. No visible canal hematoma. Disc levels: Multilevel disc height loss is noted throughout the cervical spine. Other: None. CT CHEST FINDINGS Cardiovascular: The thoracic  aorta is unremarkable. Coronary artery calcifications are noted. The heart size is normal. There is no significant pericardial effusion. Mediastinum/Nodes: --No mediastinal or hilar lymphadenopathy. --No axillary lymphadenopathy. --No supraclavicular lymphadenopathy. --Normal thyroid gland. --The esophagus is unremarkable Lungs/Pleura: There is some mild atelectasis at the lung bases and within the right middle lobe. There is no pneumothorax. No significant pleural effusion. Musculoskeletal: No chest wall  abnormality. No acute or significant osseous findings. CT ABDOMEN AND PELVIS FINDINGS Hepatobiliary: There is decreased hepatic attenuation suggestive of hepatic steatosis. Normal gallbladder.There is no biliary ductal dilation. Pancreas: Normal contours without ductal dilatation. No peripancreatic fluid collection. Spleen: No splenic laceration or hematoma. Adrenals/Urinary Tract: --Adrenal glands: No adrenal hemorrhage. 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 There are no radiopaque kidney stones. --Urinary bladder: There is a focus of gas within the urinary bladder. Stomach/Bowel: --Stomach/Duodenum: No hiatal hernia or other gastric abnormality. Normal duodenal course and caliber. --Small bowel: No dilatation or inflammation. --Colon: There is scattered colonic diverticula without CT evidence for diverticulitis. --Appendix: Not visualized. No right lower quadrant inflammation or free fluid. Vascular/Lymphatic: Normal course and caliber of the major abdominal vessels. --No retroperitoneal lymphadenopathy. --No mesenteric lymphadenopathy. --No pelvic or inguinal lymphadenopathy. Reproductive: Unremarkable Other: No ascites or free air. The abdominal wall is normal. Musculoskeletal. There appears to be some mild height loss of the T11 vertebral body, likely chronic. There is an age indeterminate fracture of the L3 transverse process on the right. This is favored to be chronic. IMPRESSION: 1. No acute intracranial process. 2. No acute cervical spine fracture or dislocation. 3. Right frontal scalp soft tissue swelling. 4. No acute maxillofacial fracture. 5. No acute intra-abdominal or pelvic injury. 6. Hepatic steatosis. 7. Horseshoe kidney. 8. Colonic diverticulosis without CT evidence for diverticulitis. 9. There is a chronic appearing L3 transverse process fracture on the right. 10. Focus of gas within the urinary bladder. Correlation with urinalysis and patient history is recommended.  This may be secondary to prior instrumentation or an infection with a gas-forming organism. Electronically Signed: By: Katherine Mantle M.D. 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 Electronically Signed   By: Katherine Mantle M.D.   On: 03/17/2019 18:08   Result Date: 03/17/2019 CLINICAL DATA:  Acute pain due to trauma. Hematoma to the right eye. EXAM: CT HEAD WITHOUT CONTRAST CT MAXILLOFACIAL WITHOUT CONTRAST CT CERVICAL SPINE WITHOUT CONTRAST CT CHEST, ABDOMEN AND PELVIS WITH CONTRAST TECHNIQUE: Contiguous axial images were obtained from the base of the skull through the vertex without intravenous contrast. Multidetector CT imaging of the maxillofacial structures was performed. Multiplanar CT image reconstructions were also generated. A small metallic BB was placed on the right temple in order to reliably differentiate right from left. Multidetector CT imaging of the cervical spine was performed without intravenous contrast. Multiplanar CT image reconstructions were also generated. Multidetector CT imaging of the chest, abdomen and pelvis was performed following the standard protocol during bolus administration of intravenous contrast. CONTRAST:  80mL OMNIPAQUE IOHEXOL 300 MG/ML  SOLN COMPARISON:  CT head dated 08/16/2010 FINDINGS: CT HEAD: Brain: No evidence of acute infarction, hemorrhage, hydrocephalus, extra-axial collection or mass lesion/mass effect. There is advanced atrophy, greater than expected for the patient's stated age. Vascular: No hyperdense vessel or unexpected calcification. Skull: Normal. Negative for fracture or focal lesion. There is right frontal scalp  swelling. Other: None. CT MAXILLOFACIAL FINDINGS Osseous: No fracture or mandibular dislocation. No destructive process. Orbits: Negative. No traumatic or  inflammatory finding. Sinuses: Clear. Soft tissues: There is right periorbital soft tissue swelling. CT CERVICAL SPINE FINDINGS Alignment: Normal. Skull base and vertebrae: No acute fracture. No primary bone lesion or focal pathologic process. 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 There are multilevel degenerative changes throughout the cervical spine, greatest at the C5-C6 level. There is osseous neural foraminal narrowing at the C4-C5 and C5-C6 levels bilaterally. Soft tissues and spinal canal: No prevertebral fluid or swelling. No visible canal hematoma. Disc levels: Multilevel disc height loss is noted throughout the cervical spine. Other: None. CT CHEST FINDINGS Cardiovascular: The thoracic aorta is unremarkable. Coronary artery calcifications are noted. The heart size is normal. There is no significant pericardial effusion. Mediastinum/Nodes: --No mediastinal or hilar lymphadenopathy. --No axillary lymphadenopathy. --No supraclavicular lymphadenopathy. --Normal thyroid gland. --The esophagus is unremarkable Lungs/Pleura: There is some mild atelectasis at the lung bases and within the right middle lobe. There is no pneumothorax. No significant pleural effusion. Musculoskeletal: No chest wall abnormality. No acute or significant osseous findings. CT ABDOMEN AND PELVIS FINDINGS Hepatobiliary: There is decreased hepatic attenuation suggestive of hepatic steatosis. Normal gallbladder.There is no biliary ductal dilation. Pancreas: Normal contours without ductal dilatation. No peripancreatic fluid collection. Spleen: No splenic laceration or hematoma. Adrenals/Urinary Tract: --Adrenal glands: No adrenal hemorrhage. 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 There are no radiopaque kidney stones. --Urinary bladder: There is a focus of gas within the urinary bladder. Stomach/Bowel: --Stomach/Duodenum: No hiatal hernia or other gastric abnormality. Normal duodenal course and  caliber. --Small bowel: No dilatation or inflammation. --Colon: There is scattered colonic diverticula without CT evidence for diverticulitis. --Appendix: Not visualized. No right lower quadrant inflammation or free fluid. Vascular/Lymphatic: Normal course and caliber of the major abdominal vessels. --No retroperitoneal lymphadenopathy. --No mesenteric lymphadenopathy. --No pelvic or inguinal lymphadenopathy. Reproductive: Unremarkable Other: No ascites or free air. The abdominal wall is normal. Musculoskeletal. There appears to be some mild height loss of the T11 vertebral body, likely chronic. There is an age indeterminate fracture of the L3 transverse process on the right. This is favored to be chronic. IMPRESSION: 1. No acute intracranial process. 2. No acute cervical spine fracture or dislocation. 3. Right frontal scalp soft tissue swelling. 4. No acute maxillofacial fracture. 5. No acute intra-abdominal or pelvic injury. 6. Hepatic steatosis. 7. Horseshoe kidney. 8. Colonic diverticulosis without CT evidence for diverticulitis. 9. There is a chronic appearing L3 transverse process fracture on the right. 10. Focus of gas within the urinary bladder. Correlation with urinalysis and patient history is recommended. This may be secondary to prior instrumentation or an infection with a gas-forming organism. Electronically Signed: By: Katherine Mantlehristopher  Green M.D. 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 Electronically Signed   By: Katherine Mantlehristopher  Green M.D.   On: 03/17/2019 18:08   Result Date: 03/17/2019 CLINICAL DATA:  Acute pain due to trauma. Hematoma to the right eye. EXAM: CT HEAD WITHOUT CONTRAST CT MAXILLOFACIAL WITHOUT CONTRAST CT CERVICAL SPINE WITHOUT CONTRAST CT CHEST, ABDOMEN AND PELVIS WITH CONTRAST TECHNIQUE: Contiguous  axial images were obtained from the base of the skull through the vertex without intravenous contrast. Multidetector CT imaging of the maxillofacial structures was performed. Multiplanar CT  image reconstructions were also generated. A small metallic BB was placed on the right temple in order to reliably differentiate right from left. Multidetector CT imaging of the cervical spine was performed without intravenous contrast. Multiplanar CT image reconstructions were also generated. Multidetector CT imaging of the chest, abdomen and pelvis was performed following the standard protocol during bolus administration of intravenous contrast. CONTRAST:  80mL OMNIPAQUE IOHEXOL 300 MG/ML  SOLN COMPARISON:  CT head dated 08/16/2010 FINDINGS: CT HEAD: Brain: No evidence of acute infarction, hemorrhage, hydrocephalus, extra-axial collection or mass lesion/mass effect. There is advanced atrophy, greater than expected for the patient's stated age. Vascular: No hyperdense vessel or unexpected calcification. Skull: Normal. Negative for fracture or focal lesion. There is right frontal scalp swelling. Other: None. CT MAXILLOFACIAL FINDINGS Osseous: No fracture or mandibular dislocation. No destructive process. Orbits: Negative. No traumatic or inflammatory finding. Sinuses: Clear. Soft tissues: There is right periorbital soft tissue swelling. CT CERVICAL SPINE FINDINGS Alignment: Normal. Skull base and vertebrae: No acute fracture. No primary bone lesion or focal pathologic process. 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 There are multilevel degenerative changes throughout the cervical spine, greatest at the C5-C6 level. There is osseous neural foraminal narrowing at the C4-C5 and C5-C6 levels bilaterally. Soft tissues and spinal canal: No prevertebral fluid or swelling. No visible canal hematoma. Disc levels: Multilevel disc height loss is noted throughout the cervical spine. Other: None. CT CHEST FINDINGS  Cardiovascular: The thoracic aorta is unremarkable. Coronary artery calcifications are noted. The heart size is normal. There is no significant pericardial effusion. Mediastinum/Nodes: --No mediastinal or hilar lymphadenopathy. --No axillary lymphadenopathy. --No supraclavicular lymphadenopathy. --Normal thyroid gland. --The esophagus is unremarkable Lungs/Pleura: There is some mild atelectasis at the lung bases and within the right middle lobe. There is no pneumothorax. No significant pleural effusion. Musculoskeletal: No chest wall abnormality. No acute or significant osseous findings. CT ABDOMEN AND PELVIS FINDINGS Hepatobiliary: There is decreased hepatic attenuation suggestive of hepatic steatosis. Normal gallbladder.There is no biliary ductal dilation. Pancreas: Normal contours without ductal dilatation. No peripancreatic fluid collection. Spleen: No splenic laceration or hematoma. Adrenals/Urinary Tract: --Adrenal glands: No adrenal hemorrhage. 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 There are no radiopaque kidney stones. --Urinary bladder: There is a focus of gas within the urinary bladder. Stomach/Bowel: --Stomach/Duodenum: No hiatal hernia or other gastric abnormality. Normal duodenal course and caliber. --Small bowel: No dilatation or inflammation. --Colon: There is scattered colonic diverticula without CT evidence for diverticulitis. --Appendix: Not visualized. No right lower quadrant inflammation or free fluid. Vascular/Lymphatic: Normal course and caliber of the major abdominal vessels. --No retroperitoneal lymphadenopathy. --No mesenteric lymphadenopathy. --No pelvic or inguinal lymphadenopathy. Reproductive: Unremarkable Other: No ascites or free air. The abdominal wall is normal. Musculoskeletal. There appears to be some mild height loss of the T11 vertebral body, likely chronic. There is an age indeterminate fracture of the L3 transverse process on the right. This is  favored to be chronic. IMPRESSION: 1. No acute intracranial process. 2. No acute cervical spine fracture or dislocation. 3. Right frontal scalp soft tissue swelling. 4. No acute maxillofacial fracture. 5. No acute intra-abdominal or pelvic injury. 6. Hepatic steatosis. 7. Horseshoe kidney. 8. Colonic diverticulosis without CT evidence for diverticulitis. 9. There is a chronic appearing L3 transverse process fracture on the right. 10. Focus of gas within the urinary bladder. Correlation with urinalysis and patient history is recommended. This may be secondary to prior instrumentation or an infection with a gas-forming  organism. Electronically Signed: By: Katherine Mantle M.D. 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 Electronically Signed   By: Katherine Mantle M.D.   On: 03/17/2019 18:08   Result Date: 03/17/2019 CLINICAL DATA:  Acute pain due to trauma. Hematoma to the right eye. EXAM: CT HEAD WITHOUT CONTRAST CT MAXILLOFACIAL WITHOUT CONTRAST CT CERVICAL SPINE WITHOUT CONTRAST CT CHEST, ABDOMEN AND PELVIS WITH CONTRAST TECHNIQUE: Contiguous axial images were obtained from the base of the skull through the vertex without intravenous contrast. Multidetector CT imaging of the maxillofacial structures was performed. Multiplanar CT image reconstructions were also generated. A small metallic BB was placed on the right temple in order to reliably differentiate right from left. Multidetector CT imaging of the cervical spine was performed without intravenous contrast. Multiplanar CT image reconstructions were also generated. Multidetector CT imaging of the chest, abdomen and pelvis was performed following the standard protocol during bolus administration of intravenous contrast. CONTRAST:  80mL OMNIPAQUE IOHEXOL 300 MG/ML  SOLN  COMPARISON:  CT head dated 08/16/2010 FINDINGS: CT HEAD: Brain: No evidence of acute infarction, hemorrhage, hydrocephalus, extra-axial collection or mass lesion/mass effect. There is advanced atrophy, greater than expected for the patient's stated age. Vascular: No hyperdense vessel or unexpected calcification. Skull: Normal. Negative for fracture or focal lesion. There is right frontal scalp swelling. Other: None. CT MAXILLOFACIAL FINDINGS Osseous: No fracture or mandibular dislocation. No destructive process. Orbits: Negative. No traumatic or inflammatory finding. Sinuses: Clear. Soft tissues: There is right periorbital soft tissue swelling. CT CERVICAL SPINE FINDINGS Alignment: Normal. Skull base and vertebrae: No acute fracture. No primary bone lesion or focal pathologic process. 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 There are multilevel degenerative changes throughout the cervical spine, greatest at the C5-C6 level. There is osseous neural foraminal narrowing at the C4-C5 and C5-C6 levels bilaterally. Soft tissues and spinal canal: No prevertebral fluid or swelling. No visible canal hematoma. Disc levels: Multilevel disc height loss is noted throughout the cervical spine. Other: None. CT CHEST FINDINGS Cardiovascular: The thoracic aorta is unremarkable. Coronary artery calcifications are noted. The heart size is normal. There is no significant pericardial effusion. Mediastinum/Nodes: --No mediastinal or hilar lymphadenopathy. --No axillary lymphadenopathy. --No supraclavicular lymphadenopathy. --Normal thyroid gland. --The esophagus is unremarkable Lungs/Pleura: There is some mild atelectasis at the lung bases and within the right middle lobe. There is no pneumothorax. No significant pleural effusion. Musculoskeletal: No chest wall abnormality. No acute or significant osseous findings. CT ABDOMEN AND PELVIS FINDINGS Hepatobiliary: There is decreased hepatic attenuation suggestive of hepatic  steatosis. Normal gallbladder.There is no biliary ductal dilation. Pancreas: Normal contours without ductal dilatation. No peripancreatic fluid collection. Spleen: No splenic laceration or hematoma. Adrenals/Urinary Tract: --Adrenal glands: No adrenal hemorrhage. 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 There are no radiopaque kidney stones. --Urinary bladder: There is a focus of gas within the urinary bladder. Stomach/Bowel: --Stomach/Duodenum: No hiatal hernia or other gastric abnormality. Normal duodenal course and caliber. --Small bowel: No dilatation or inflammation. --Colon: There is scattered colonic diverticula without CT evidence for diverticulitis. --Appendix: Not visualized. No right lower quadrant inflammation or free fluid. Vascular/Lymphatic: Normal course and caliber of the major abdominal vessels. --No retroperitoneal lymphadenopathy. --No mesenteric lymphadenopathy. --No pelvic or inguinal lymphadenopathy. Reproductive: Unremarkable Other: No ascites or free air. The abdominal wall is  normal. Musculoskeletal. There appears to be some mild height loss of the T11 vertebral body, likely chronic. There is an age indeterminate fracture of the L3 transverse process on the right. This is favored to be chronic. IMPRESSION: 1. No acute intracranial process. 2. No acute cervical spine fracture or dislocation. 3. Right frontal scalp soft tissue swelling. 4. No acute maxillofacial fracture. 5. No acute intra-abdominal or pelvic injury. 6. Hepatic steatosis. 7. Horseshoe kidney. 8. Colonic diverticulosis without CT evidence for diverticulitis. 9. There is a chronic appearing L3 transverse process fracture on the right. 10. Focus of gas within the urinary bladder. Correlation with urinalysis and patient history is recommended. This may be secondary to prior instrumentation or an infection with a gas-forming organism. Electronically Signed: By: Katherine Mantlehristopher  Green M.D. On: 03/17/2019 18:04    Ct L-spine No Charge  Result Date: 03/17/2019 CLINICAL DATA:  Back pain, minor trauma. Patient was found on the floor with sore on chest in left elbow and hematoma over the right eye. EXAM: CT LUMBAR SPINE WITHOUT CONTRAST TECHNIQUE: Multidetector CT imaging of the lumbar spine was performed without intravenous contrast administration. Multiplanar CT image reconstructions were also generated. COMPARISON:  None. FINDINGS: Segmentation: 5 non rib-bearing lumbar type vertebral bodies are present. The lowest fully formed vertebral body is L5. Alignment: There is some straightening of the normal lumbar lordosis. No significant listhesis is present. Vertebrae: Vertebral body heights are maintained. Well corticated fragment at the right transverse process of L3 is most consistent with remote trauma. There is no evidence for acute fracture. Paraspinal and other soft tissues: A horseshoe kidney is noted. Visualized paraspinous soft tissues are otherwise unremarkable. Disc levels: Broad-based disc protrusions are present at L3-4, L4-5, and L5-S1. Disc material extends into the foramina at each of these levels. This contributes to mild foraminal narrowing bilaterally. No other significant disc disease or stenosis is evident. IMPRESSION: 1. No acute abnormality. 2. Remote fracture involving the right transverse process of L3. 3. 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 Electronically Signed   By: Marin Robertshristopher  Mattern M.D.   On: 03/17/2019 17:50   Dg Knee Complete 4 Views Left  Result Date: 03/17/2019 CLINICAL DATA:  Fall EXAM: LEFT KNEE - COMPLETE 4+ VIEW COMPARISON:  None. FINDINGS: No fracture or malalignment. Minimal medial and patellofemoral degenerative change. No large knee effusion IMPRESSION: No acute osseous abnormality Electronically Signed   By: Jasmine PangKim  Fujinaga M.D.   On: 03/17/2019 16:32   Dg Knee Complete 4 Views Right  Result Date: 03/17/2019 CLINICAL DATA:  Larey SeatFell in bathroom EXAM: RIGHT KNEE -  COMPLETE 4+ VIEW COMPARISON:  None. FINDINGS: No fracture or malalignment. No large knee effusion. Joint spaces are relatively maintained IMPRESSION: No acute osseous abnormality Electronically Signed   By: Jasmine PangKim  Fujinaga M.D.   On: 03/17/2019 16:31   Ct Maxillofacial Wo Contrast  Addendum Date: 03/17/2019   ADDENDUM REPORT: 03/17/2019 18:08 ADDENDUM: There is intracranial atrophy that is significantly greater than expected for the patient's recorded age. Electronically Signed   By: Katherine Mantlehristopher  Green M.D.   On: 03/17/2019 18:08   Result Date: 03/17/2019 CLINICAL DATA:  Acute pain due to trauma. Hematoma to the right eye. EXAM: CT HEAD WITHOUT CONTRAST CT MAXILLOFACIAL WITHOUT CONTRAST CT CERVICAL SPINE WITHOUT CONTRAST CT CHEST, ABDOMEN AND PELVIS WITH CONTRAST TECHNIQUE: Contiguous axial images were obtained from the base of the skull through the vertex without intravenous contrast. Multidetector CT imaging of the maxillofacial structures was performed. Multiplanar CT image reconstructions were  also generated. A small metallic BB was placed on the right temple in order to reliably differentiate right from left. Multidetector CT imaging of the cervical spine was performed without intravenous contrast. Multiplanar CT image reconstructions were also generated. Multidetector CT imaging of the chest, abdomen and pelvis was performed following the standard protocol during bolus administration of intravenous contrast. CONTRAST:  80mL OMNIPAQUE IOHEXOL 300 MG/ML  SOLN COMPARISON:  CT head dated 08/16/2010 FINDINGS: CT HEAD: Brain: No evidence of acute infarction, hemorrhage, hydrocephalus, extra-axial collection or mass lesion/mass effect. There is advanced atrophy, greater than expected for the patient's stated age. Vascular: No hyperdense vessel or unexpected calcification. Skull: Normal. Negative for fracture or focal lesion. There is right frontal scalp swelling. Other: None. CT MAXILLOFACIAL FINDINGS Osseous: No  fracture or mandibular dislocation. No destructive process. Orbits: Negative. No traumatic or inflammatory finding. Sinuses: Clear. Soft tissues: There is right periorbital soft tissue swelling. CT CERVICAL SPINE FINDINGS Alignment: Normal. Skull base and vertebrae: No acute fracture. No primary bone lesion or focal pathologic process. 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 There are multilevel degenerative changes throughout the cervical spine, greatest at the C5-C6 level. There is osseous neural foraminal narrowing at the C4-C5 and C5-C6 levels bilaterally. Soft tissues and spinal canal: No prevertebral fluid or swelling. No visible canal hematoma. Disc levels: Multilevel disc height loss is noted throughout the cervical spine. Other: None. CT CHEST FINDINGS Cardiovascular: The thoracic aorta is unremarkable. Coronary artery calcifications are noted. The heart size is normal. There is no significant pericardial effusion. Mediastinum/Nodes: --No mediastinal or hilar lymphadenopathy. --No axillary lymphadenopathy. --No supraclavicular lymphadenopathy. --Normal thyroid gland. --The esophagus is unremarkable Lungs/Pleura: There is some mild atelectasis at the lung bases and within the right middle lobe. There is no pneumothorax. No significant pleural effusion. Musculoskeletal: No chest wall abnormality. No acute or significant osseous findings. CT ABDOMEN AND PELVIS FINDINGS Hepatobiliary: There is decreased hepatic attenuation suggestive of hepatic steatosis. Normal gallbladder.There is no biliary ductal dilation. Pancreas: Normal contours without ductal dilatation. No peripancreatic fluid collection. Spleen: No splenic laceration or hematoma. Adrenals/Urinary Tract: --Adrenal glands: No adrenal hemorrhage. 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 There are no radiopaque kidney stones. --Urinary bladder: There is a focus of gas within the urinary bladder. Stomach/Bowel:  --Stomach/Duodenum: No hiatal hernia or other gastric abnormality. Normal duodenal course and caliber. --Small bowel: No dilatation or inflammation. --Colon: There is scattered colonic diverticula without CT evidence for diverticulitis. --Appendix: Not visualized. No right lower quadrant inflammation or free fluid. Vascular/Lymphatic: Normal course and caliber of the major abdominal vessels. --No retroperitoneal lymphadenopathy. --No mesenteric lymphadenopathy. --No pelvic or inguinal lymphadenopathy. Reproductive: Unremarkable Other: No ascites or free air. The abdominal wall is normal. Musculoskeletal. There appears to be some mild height loss of the T11 vertebral body, likely chronic. There is an age indeterminate fracture of the L3 transverse process on the right. This is favored to be chronic. IMPRESSION: 1. No acute intracranial process. 2. No acute cervical spine fracture or dislocation. 3. Right frontal scalp soft tissue swelling. 4. No acute maxillofacial fracture. 5. No acute intra-abdominal or pelvic injury. 6. Hepatic steatosis. 7. Horseshoe kidney. 8. Colonic diverticulosis without CT evidence for diverticulitis. 9. There is a chronic appearing L3 transverse process fracture on the right. 10. Focus of gas within the urinary bladder. Correlation with urinalysis and patient history is recommended. This may be secondary to prior instrumentation or an infection with a gas-forming organism. Electronically Signed:  By: Katherine Mantle M.D. On: 03/17/2019 18:04    Procedures .Critical Care Performed by: Linwood Dibbles, PA-C Authorized by: Linwood Dibbles, PA-C   Critical care provider statement:    Critical care time (minutes):  45   Critical care was necessary to treat or prevent imminent or life-threatening deterioration of the following conditions:  Sepsis, circulatory failure and trauma   Critical care was time spent personally by me on the following activities:  Discussions with  consultants, evaluation of patient's response to treatment, examination of patient, ordering and performing treatments and interventions, ordering and review of laboratory studies, ordering and review of radiographic studies, pulse oximetry, re-evaluation of patient's condition, obtaining history from patient or surrogate and review of old charts   (including critical care time)  Medications Ordered in ED Medications  vancomycin (VANCOCIN) 1,500 mg in sodium chloride 0.9 % 500 mL IVPB (has no administration in time range)  ceFEPIme (MAXIPIME) 2 g in sodium chloride 0.9 % 100 mL IVPB (has no administration in time range)  propranolol (INNOPRAN XL) 24 hr capsule 80 mg (has no administration in time range)  amLODipine (NORVASC) tablet 10 mg (10 mg Oral Given 03/17/19 1854)  LORazepam (ATIVAN) injection 0-4 mg (has no administration in time range)    Or  LORazepam (ATIVAN) tablet 0-4 mg (has no administration in time range)  LORazepam (ATIVAN) injection 0-4 mg (has no administration in time range)    Or  LORazepam (ATIVAN) tablet 0-4 mg (has no administration in time range)  thiamine (VITAMIN B-1) tablet 100 mg ( Oral See Alternative 03/17/19 1902)    Or  thiamine (B-1) injection 100 mg (100 mg Intravenous Given 03/17/19 1902)  sodium chloride 0.9 % bolus 1,000 mL (0 mLs Intravenous Stopped 03/17/19 1621)  Tdap (BOOSTRIX) injection 0.5 mL (0.5 mLs Intramuscular Given 03/17/19 1543)  ceFEPIme (MAXIPIME) 2 g in sodium chloride 0.9 % 100 mL IVPB (0 g Intravenous Stopped 03/17/19 1621)  metroNIDAZOLE (FLAGYL) IVPB 500 mg (0 mg Intravenous Stopped 03/17/19 1727)  vancomycin (VANCOCIN) 2,500 mg in sodium chloride 0.9 % 500 mL IVPB (0 mg Intravenous Stopped 03/17/19 1823)  sodium chloride 0.9 % bolus 1,000 mL (0 mLs Intravenous Stopped 03/17/19 1825)  iohexol (OMNIPAQUE) 300 MG/ML solution 80 mL (80 mLs Intravenous Contrast Given 03/17/19 1706)  LORazepam (ATIVAN) injection 0.5 mg (0.5 mg Intravenous Given 03/17/19 1900)     Initial Impression / Assessment and Plan / ED Course  I have reviewed the triage vital signs and the nursing notes.  Pertinent labs & imaging results that were available during my care of the patient were reviewed by me and considered in my medical decision making (see chart for details).  48 year old male appears ill presents for evaluation after being found down by EMS at home.  Patient sitting on toilet on Tuesday morning and has no recollection of events after that.  Family called EMS today as they have not heard from him.  On welfare check patient found prone in bathroom.  She does not recall prior events.  States he felt so weak that he was not able to get up off the ground.  Denies chronic alcohol use however states he does drink occasionally.  No illicit drug use.  Patient with multiple contusions, abrasions to upper and lower extremities.  He appears very weak in all 4 extremities.  He is barely able to lift his extremities off the bed however he has equal movement bilaterally.  Tetanus updated.  He does have  hematoma to his right periorbital area while out evidence of orbital entrapment.  Abrasions to upper abdomen with generalized tenderness to palpation however negative Murphy sign.  Patient arrived febrile with rectal temp 100.8, tachycardic and intermittently tachypneic.  Has had generalized cough with diarrhea times weeks.  Will have occasional bright red blood per rectum. Occult negative. Obtain trauma labs, scans as well as sepsis criteria given his tachycardia, tachypnea and febrile state.  Broad-spectrum antibiotics and fluids given.  Patient likely in rhabdomyolysis given he has been down for 3 days.  Labs and imaging personally reviewed: Imaging without acute findings.  Unable to assess for intraocular damage to right eye secondary to swelling.  He does have some mild bloody drainage to his right eye on reevaluation however denies pain.  Labs significant with elevated lactic acid,  transaminitis, elevated bilirubin, AKI, rhabdomyolysis, elevated troponin trending down and denies current CP.  Prior history of transaminitis with elevated bilirubin.  Negative Murphy sign.  Will hold on ultrasound and discuss with admitting team.  Low suspicion for cholangitis, cholecystitis.  Given broad-spectrum antibiotics for possible sepsis of unknown origin.  He was given his home blood pressure medicines due to hypertension. Patient defervesce without medication.  Patient has had some tremors to his extremities.  He denies chronic alcohol use, only social use however given his elevated transaminitis and prior history with GI I think he may have a more extensive alcohol history than he is leading to believe.  Will give small dose of Ativan and in place CIWA protocol. Denies SI, HI, AVH.  Patient is able to track with his left eye and follow commands.  I have low suspicion for seizures as cause of his tremors. Has not seen his PCP for years. Will place orders for admission.  The patient appears reasonably stabilized for admission considering the current resources, flow, and capabilities available in the ED at this time, and I doubt any other [facility] requiring further screening and/or treatment in the ED prior to admission.  She is sitting up tolerating p.o. intake.  Denies any current pain.  No lateral weakness to suggest CVA as cause of syncopal episode  Consulted with Dr. Oswaldo Done with internal med teaching service who agrees to evaluate patient for admission.  Patient father, Harshaan Whang has contacted me.  Patient has no prior medical history however he does state that patient is a heavy alcohol user.  He denies any DTs or withdrawal symptoms however patient has been relatively recluse from family since he lost his job approximately 1 year ago.  Contact information for family [phone] Salahuddin, Arismendez, patients Mother and Father      Kaicen Desena was evaluated in Emergency  Department on 03/17/2019 for the symptoms described in the history of present illness. He was evaluated in the context of the global COVID-19 pandemic, which necessitated consideration that the patient might be at risk for infection with the SARS-CoV-2 virus that causes COVID-19. Institutional protocols and algorithms that pertain to the evaluation of patients at risk for COVID-19 are in a state of rapid change based on information released by regulatory bodies including the CDC and federal and state organizations. These policies and algorithms were followed during the patient's care in the ED. Final Clinical Impressions(s) / ED Diagnoses   Final diagnoses:  Fall, initial encounter  Syncope, unspecified syncope type  Elevated troponin  Hematoma of eyelid  Non-traumatic rhabdomyolysis  AKI (acute kidney injury) (HCC)  SIRS (systemic inflammatory response syndrome) (HCC)  ED Discharge Orders    None       Liandra Mendia A, PA-C 03/17/19 2128    Wynetta Fines, MD 03/23/19 2241

## 2019-03-17 NOTE — ED Notes (Signed)
Regular dinner tray ordered w/ jello

## 2019-03-17 NOTE — ED Triage Notes (Signed)
Pt arrives via EMS from home with reports of pt last seen Tuesday. Police went for wellness check and found pt laying prone in bathroom. Pt has sore on chest and left elbow. Pt alert and states he has had diarrhea. 1L fluid given by EMS. Hematoma to right eye.

## 2019-03-18 DIAGNOSIS — S0010XA Contusion of unspecified eyelid and periocular area, initial encounter: Secondary | ICD-10-CM | POA: Diagnosis present

## 2019-03-18 DIAGNOSIS — N179 Acute kidney failure, unspecified: Secondary | ICD-10-CM | POA: Diagnosis present

## 2019-03-18 DIAGNOSIS — T510X2A Toxic effect of ethanol, intentional self-harm, initial encounter: Secondary | ICD-10-CM

## 2019-03-18 DIAGNOSIS — F1020 Alcohol dependence, uncomplicated: Secondary | ICD-10-CM

## 2019-03-18 DIAGNOSIS — F332 Major depressive disorder, recurrent severe without psychotic features: Secondary | ICD-10-CM | POA: Insufficient documentation

## 2019-03-18 LAB — CBC
HCT: 43.5 % (ref 39.0–52.0)
Hemoglobin: 15 g/dL (ref 13.0–17.0)
MCH: 38.8 pg — ABNORMAL HIGH (ref 26.0–34.0)
MCHC: 34.5 g/dL (ref 30.0–36.0)
MCV: 112.4 fL — ABNORMAL HIGH (ref 80.0–100.0)
Platelets: 55 10*3/uL — ABNORMAL LOW (ref 150–400)
RBC: 3.87 MIL/uL — ABNORMAL LOW (ref 4.22–5.81)
RDW: 13 % (ref 11.5–15.5)
WBC: 6 10*3/uL (ref 4.0–10.5)
nRBC: 0 % (ref 0.0–0.2)

## 2019-03-18 LAB — CK
Total CK: 4365 U/L — ABNORMAL HIGH (ref 49–397)
Total CK: 5150 U/L — ABNORMAL HIGH (ref 49–397)
Total CK: 7250 U/L — ABNORMAL HIGH (ref 49–397)
Total CK: 8570 U/L — ABNORMAL HIGH (ref 49–397)

## 2019-03-18 LAB — BASIC METABOLIC PANEL
Anion gap: 13 (ref 5–15)
Anion gap: 10 (ref 5–15)
Anion gap: 7 (ref 5–15)
Anion gap: 5 (ref 5–15)
BUN: 38 mg/dL — ABNORMAL HIGH (ref 6–20)
BUN: 33 mg/dL — ABNORMAL HIGH (ref 6–20)
BUN: 31 mg/dL — ABNORMAL HIGH (ref 6–20)
BUN: 29 mg/dL — ABNORMAL HIGH (ref 6–20)
CO2: 22 mmol/L (ref 22–32)
CO2: 22 mmol/L (ref 22–32)
CO2: 24 mmol/L (ref 22–32)
CO2: 23 mmol/L (ref 22–32)
Calcium: 7.6 mg/dL — ABNORMAL LOW (ref 8.9–10.3)
Calcium: 7.7 mg/dL — ABNORMAL LOW (ref 8.9–10.3)
Calcium: 7.4 mg/dL — ABNORMAL LOW (ref 8.9–10.3)
Calcium: 7.2 mg/dL — ABNORMAL LOW (ref 8.9–10.3)
Chloride: 109 mmol/L (ref 98–111)
Chloride: 114 mmol/L — ABNORMAL HIGH (ref 98–111)
Chloride: 113 mmol/L — ABNORMAL HIGH (ref 98–111)
Chloride: 113 mmol/L — ABNORMAL HIGH (ref 98–111)
Creatinine, Ser: 1.62 mg/dL — ABNORMAL HIGH (ref 0.61–1.24)
Creatinine, Ser: 1.33 mg/dL — ABNORMAL HIGH (ref 0.61–1.24)
Creatinine, Ser: 1.23 mg/dL (ref 0.61–1.24)
Creatinine, Ser: 1.17 mg/dL (ref 0.61–1.24)
GFR calc Af Amer: 58 mL/min — ABNORMAL LOW (ref >60)
GFR calc Af Amer: >60 mL/min (ref >60)
GFR calc Af Amer: >60 mL/min (ref >60)
GFR calc Af Amer: >60 mL/min (ref >60)
GFR calc non Af Amer: 50 mL/min — ABNORMAL LOW (ref >60)
GFR calc non Af Amer: >60 mL/min (ref >60)
GFR calc non Af Amer: >60 mL/min (ref >60)
GFR calc non Af Amer: >60 mL/min (ref >60)
Glucose, Bld: 141 mg/dL — ABNORMAL HIGH (ref 70–99)
Glucose, Bld: 101 mg/dL — ABNORMAL HIGH (ref 70–99)
Glucose, Bld: 108 mg/dL — ABNORMAL HIGH (ref 70–99)
Glucose, Bld: 132 mg/dL — ABNORMAL HIGH (ref 70–99)
Potassium: 3.5 mmol/L (ref 3.5–5.1)
Potassium: 3.6 mmol/L (ref 3.5–5.1)
Potassium: 3.6 mmol/L (ref 3.5–5.1)
Potassium: 3.8 mmol/L (ref 3.5–5.1)
Sodium: 144 mmol/L (ref 135–145)
Sodium: 146 mmol/L — ABNORMAL HIGH (ref 135–145)
Sodium: 144 mmol/L (ref 135–145)
Sodium: 141 mmol/L (ref 135–145)

## 2019-03-18 LAB — RAPID URINE DRUG SCREEN, HOSP PERFORMED
Amphetamines: POSITIVE — AB
Barbiturates: NOT DETECTED
Benzodiazepines: NOT DETECTED
Cocaine: NOT DETECTED
Opiates: NOT DETECTED
Tetrahydrocannabinol: NOT DETECTED

## 2019-03-18 LAB — COMPREHENSIVE METABOLIC PANEL
ALT: 41 U/L (ref 0–44)
AST: 189 U/L — ABNORMAL HIGH (ref 15–41)
Albumin: 2.3 g/dL — ABNORMAL LOW (ref 3.5–5.0)
Alkaline Phosphatase: 32 U/L — ABNORMAL LOW (ref 38–126)
Anion gap: 14 (ref 5–15)
BUN: 35 mg/dL — ABNORMAL HIGH (ref 6–20)
CO2: 22 mmol/L (ref 22–32)
Calcium: 7.7 mg/dL — ABNORMAL LOW (ref 8.9–10.3)
Chloride: 109 mmol/L (ref 98–111)
Creatinine, Ser: 1.39 mg/dL — ABNORMAL HIGH (ref 0.61–1.24)
GFR calc Af Amer: >60 mL/min (ref >60)
GFR calc non Af Amer: >60 mL/min (ref >60)
Glucose, Bld: 100 mg/dL — ABNORMAL HIGH (ref 70–99)
Potassium: 3.1 mmol/L — ABNORMAL LOW (ref 3.5–5.1)
Sodium: 145 mmol/L (ref 135–145)
Total Bilirubin: 2 mg/dL — ABNORMAL HIGH (ref 0.3–1.2)
Total Protein: 5 g/dL — ABNORMAL LOW (ref 6.5–8.1)

## 2019-03-18 LAB — GLUCOSE, CAPILLARY
Glucose-Capillary: 102 mg/dL — ABNORMAL HIGH (ref 70–99)
Glucose-Capillary: 94 mg/dL (ref 70–99)

## 2019-03-18 LAB — FERRITIN: Ferritin: 889 ng/mL — ABNORMAL HIGH (ref 24–336)

## 2019-03-18 LAB — LIPASE, BLOOD: Lipase: 51 U/L (ref 11–51)

## 2019-03-18 LAB — SAVE SMEAR(SSMR), FOR PROVIDER SLIDE REVIEW

## 2019-03-18 LAB — ACETAMINOPHEN LEVEL: Acetaminophen (Tylenol), Serum: <10 ug/mL — ABNORMAL LOW (ref 10–30)

## 2019-03-18 LAB — VITAMIN B12: Vitamin B-12: 370 pg/mL (ref 180–914)

## 2019-03-18 LAB — LACTIC ACID, PLASMA: Lactic Acid, Venous: 1.4 mmol/L (ref 0.5–1.9)

## 2019-03-18 LAB — IRON AND TIBC
Iron: 29 ug/dL — ABNORMAL LOW (ref 45–182)
Saturation Ratios: 22 % (ref 17.9–39.5)
TIBC: 133 ug/dL — ABNORMAL LOW (ref 250–450)
UIBC: 104 ug/dL

## 2019-03-18 MED ORDER — ERYTHROMYCIN 5 MG/GM OP OINT
TOPICAL_OINTMENT | Freq: Four times a day (QID) | OPHTHALMIC | Status: DC
  Administered 2019-03-18 (×2): via OPHTHALMIC
  Administered 2019-03-18: 1 via OPHTHALMIC
  Administered 2019-03-18 – 2019-03-19 (×5): via OPHTHALMIC
  Filled 2019-03-18: qty 3.5

## 2019-03-18 MED ORDER — POTASSIUM CHLORIDE CRYS ER 20 MEQ PO TBCR
40.0000 meq | EXTENDED_RELEASE_TABLET | Freq: Once | ORAL | Status: AC
  Administered 2019-03-18: 40 meq via ORAL
  Filled 2019-03-18: qty 2

## 2019-03-18 MED ORDER — ACETAMINOPHEN 325 MG PO TABS
650.0000 mg | ORAL_TABLET | Freq: Four times a day (QID) | ORAL | Status: DC | PRN
  Administered 2019-03-23 – 2019-03-30 (×5): 650 mg via ORAL
  Filled 2019-03-18 (×5): qty 2

## 2019-03-18 MED ORDER — PNEUMOCOCCAL VAC POLYVALENT 25 MCG/0.5ML IJ INJ: 0.5000 mL | INJECTION | INTRAMUSCULAR | Status: DC

## 2019-03-18 MED ORDER — POTASSIUM CHLORIDE CRYS ER 20 MEQ PO TBCR
20.0000 meq | EXTENDED_RELEASE_TABLET | ORAL | Status: AC
  Administered 2019-03-18 (×2): 20 meq via ORAL
  Filled 2019-03-18 (×2): qty 1

## 2019-03-18 MED ORDER — VITAMIN B-12 1000 MCG PO TABS
1000.0000 ug | ORAL_TABLET | Freq: Every day | ORAL | Status: DC
  Administered 2019-03-18 – 2019-03-31 (×14): 1000 ug via ORAL
  Filled 2019-03-18 (×14): qty 1

## 2019-03-18 NOTE — Plan of Care (Signed)
Poc progressing.  

## 2019-03-18 NOTE — Progress Notes (Addendum)
Subjective: Patient seen at the bedside on rounds this morning.  Patient says he is not in discomfort.  Complains of swollen right eye.  Also says he has been under a lot of stress lately.  Says his family lives in many different places and his mother did not want him to come home for Christmas last year. This admission was the first time he tried to hurt himself.  He still having thoughts of self harm.  Does not remember what happened over the last few days. The last thing he remembers is getting some medication in the emergency room. We discussed his medical treatment plan and all his questions were answered.  Objective:  Vital signs in last 24 hours: Vitals:   03/18/19 0334 03/18/19 0341 03/18/19 0845 03/18/19 0900  BP:  (!) 125/94  (!) 135/103  Pulse:  95  92  Resp:  16  16  Temp: 99 F (37.2 C)  99.2 F (37.3 C)   TempSrc: Oral  Oral   SpO2:  97%  97%  Weight:      Height:       Physical Exam: General: Resting in bed, NAD.  Ice pack on right eye. HEENT: Right eye -circumferential hematoma present below the lid. vision intact CV: RRR, normal S1 and S2 no murmurs, rubs or gallops appreciated PULM: Normal work of breathing ABD: erythematous bruising and sloughed skin present right upper and left upper quadrants. MSK: All extremities 2/5 strength. Grip strength 4/5 R hand 2/5 L hand NEURO: Alert and oriented to self, no focal deficits appreciated Assessment/Plan:  Principal Problem:   Rhabdomyolysis Active Problems:   Suicide attempt (HCC)   Thrombocytopenia (HCC)   AKI (acute kidney injury) (HCC)   Hematoma of eyelid  In summary, Clayton Baldwin is a 48 year old male with a history of HTN, polysubstance abuse, depression, and hepatic steatosis who presented with EMS after being found down at his home for 4 days after taking lorazepam, trazodone and alcohol in a suicide attempt. Work-up revealed rhabdomyolysis with CK level of >8000, L eye injury, and thrombocytopenia to 65.    #Rhabdomyolysis: CK was 8252 on admission and did uptrend to 8570 on AM labs today. He received 2 L normal saline and the ED.  - Continuous IV fluids: N/S 325 mL/hr  - Serial CK's q6hrs  - Serial BMP q4hrs - Strict monitoring of urine output. Foley catheter in place  #Hx of Keratoconus  #R eye traumatic injury: Circumferential hematoma and swelling apparent. Tried irrigating at the bedside with no success. Vision appears to be intact.  - Ophtho consulted, will appreciate the following recs:  - Erythromycin ointment QID for 5 days (1/5)  - No evidence of ruptured globe. Will need continued monitoring   #Fever: Patient reported subjective fevers on admission. No leukocytosis. He was given a dose of vancomycin and cefepime, and Flagyl in the ED.  Abx have not been continued thus far and the patient has remained afebrile. The patient has no findings suggestive of infection at this point. It is possible the fever could be related to medication overdose, rhabdo. - Blood cultures pending - Monitor vital signs   #Thrombocytopenia: Chronic liver dz vs. Alcohol induced. 65 on admission. - Monitor w/ daily CBC - Blood smear  #Transaminitis  #Hepatic steatosis: Patient states that he had hepatitis as a child. Was worked up in 2017 by GI for transaminitis, but was negative for viral hepatitis. Patient had significantly elevated ferritin levels but was negative for hemochromatosis.  His anti-smooth muscle antibody titer was 1:28 and GI wanted to do a liver biopsy but was lost to follow-up. Patient's most recent labs including AST:ALT - 207:52,  elevated INR, and CT findings of hepatic steatosis are suggestive to alcohol-related hepatic injury. - Monitor w/ daily CMP - Consider GI f/u in the outpatient setting   #Depression w/ SI #Suicide Attempt: Pt says he took Lorazepam, Trazodone and drank EtOH in attempt to overdose. Patient says he has been under a lot of stress lately, noting his family is spread  out and his mother did not want him home for Christmas last year.  Also missed court hearing for potential DUI on 03/17/2019. - 1:1 suicide precautions  - Psych consult ordered, will appreciate recs   #Hx of Polysubstance Abuse: It is unclear at this time how much alcohol the patient uses.  Admits to drinking about 2 beers a day, however in 2012 the patient has a history of a hospitalization in 2012 due to excessive alcohol use. Missed court hearing as noted above. UDS amphetamines + - CIWA protocol w/ Ativan PRN - B12, folate, thiamine and folate acid supplementation daily   #Abdominal abrasions: Likely due to prolonged prone position while unconscious.  - Wound care consult   #FEN/GI - Regular diet  #Code status: Full Code   #Dispo: Pending medical course    Earlene Plater, MD Internal Medicine, PGY1 Pager: [phone]  03/18/2019,11:21 AM

## 2019-03-18 NOTE — Evaluation (Signed)
Physical Therapy Evaluation Patient Details Name: Clayton Baldwin MRN: [id number] DOB: 01-11-1971 Today's Date: 03/18/2019   History of Present Illness  Patient is a 48 year old male with a history of HTN, Alcohol use disorder, Major depression, and hepatic steatosis who presented with EMS after being found down at his home four days after he overdosed on Lorazepam, trazodone and alcohol in a suicide attempt.  Clinical Impression  Patient presents with significant limitations in mobility today requiring max A of 2 for EOB activity and with difficulty bringing cup to his mouth to drink.  Previously living independently.  Feel he will need skilled PT in the acute setting as well as follow up PT at d/c facility.      Follow Up Recommendations Other (comment)(follow up PT at inpatient psychiatric hospital)    Equipment Recommendations  Other (comment)(TBA)    Recommendations for Other Services       Precautions / Restrictions Precautions Precautions: Fall Precaution Comments: Watch HR      Mobility  Bed Mobility Overal bed mobility: Needs Assistance Bed Mobility: Rolling;Sidelying to Sit;Sit to Supine Rolling: Max assist;+2 for physical assistance Sidelying to sit: Max assist;+2 for physical assistance;HOB elevated   Sit to supine: Total assist;+2 for physical assistance   General bed mobility comments: assist to turn hips and lift trunk, pt not able to hold rail with L hand; to supine assist for trunk and legs and to scoot up in bed  Transfers Overall transfer level: Needs assistance   Transfers: Lateral/Scoot Transfers          Lateral/Scoot Transfers: Total assist;+2 physical assistance General transfer comment: sit<>stand NT (HR 150's coming up to sit and pt endorses fatigue and fear); lateral scoot pt unable to assist  Ambulation/Gait                Stairs            Wheelchair Mobility    Modified Rankin (Stroke Patients Only)       Balance  Overall balance assessment: Needs assistance Sitting-balance support: Feet supported Sitting balance-Leahy Scale: Fair Sitting balance - Comments: initially assisted due to pt felt as if falling, then able to sit about 10 minutes unsupported and to drink from cup with a straw                                     Pertinent Vitals/Pain Pain Assessment: Faces Faces Pain Scale: Hurts little more Pain Location: R eye Pain Descriptors / Indicators: Sore Pain Intervention(s): Monitored during session    Home Living Family/patient expects to be discharged to:: Private residence Living Arrangements: Alone   Type of Home: House Home Access: Stairs to enter Entrance Stairs-Rails: Right Entrance Stairs-Number of Steps: 4 Home Layout: One level Home Equipment: None      Prior Function Level of Independence: Independent               Hand Dominance   Dominant Hand: Right    Extremity/Trunk Assessment   Upper Extremity Assessment Upper Extremity Assessment: LUE deficits/detail;RUE deficits/detail RUE Deficits / Details: AAROM grossly WFL, strength shoulder flexion 2/5, elbow flexion 3/5; functionally difficult to bring a cup with a straw to his mouth in sitting LUE Deficits / Details: AAROM/PROM grossly WFL, negative grip, trace movement in shoulder and slightly more in elbow    Lower Extremity Assessment Lower Extremity Assessment: LLE deficits/detail;RLE deficits/detail RLE Deficits / Details: AAROM  generally WFL, strength hip flexion <3/5, knee extension 3/5, ankle DF 3+/5; abrasions on knee, bruising over knee and distal thigh LLE Deficits / Details: AAROM generally WFL, strength hip flexion <3/5, knee extension 3/5, ankle DF 3+/5; abrasions on knee and bruising on knee       Communication   Communication: No difficulties  Cognition Arousal/Alertness: Awake/alert Behavior During Therapy: Flat affect Overall Cognitive Status: No family/caregiver present to  determine baseline cognitive functioning Area of Impairment: Safety/judgement;Problem solving;Following commands                       Following Commands: Follows one step commands consistently;Follows one step commands with increased time Safety/Judgement: Decreased awareness of deficits   Problem Solving: Slow processing;Decreased initiation;Difficulty sequencing;Requires tactile cues;Requires verbal cues General Comments: Thought his L arm worked better than his R, but obviously wasn't moving it on his own      General Comments General comments (skin integrity, edema, etc.): HR up to 150's coming up to sit, evened out to 110-120's while sitting, but attempted lateral scooting in sitting and jumped back up to 140's.    Exercises General Exercises - Upper Extremity Shoulder Flexion: PROM;AAROM;Both;5 reps;Supine Elbow Flexion: AROM;AAROM;Both;5 reps;Supine General Exercises - Lower Extremity Ankle Circles/Pumps: AROM;Both;5 reps;Supine Short Arc Quad: AROM;5 reps;Both;Supine Heel Slides: AAROM;5 reps;Supine;Both;AROM   Assessment/Plan    PT Assessment Patient needs continued PT services  PT Problem List Decreased strength;Decreased activity tolerance;Decreased mobility;Decreased cognition;Decreased safety awareness;Decreased knowledge of precautions;Decreased balance;Decreased range of motion       PT Treatment Interventions DME instruction;Therapeutic activities;Balance training;Patient/family education;Therapeutic exercise;Functional mobility training;Gait training    PT Goals (Current goals can be found in the Care Plan section)  Acute Rehab PT Goals Patient Stated Goal: agreeable to PT PT Goal Formulation: With patient Time For Goal Achievement: 04/01/19 Potential to Achieve Goals: Good    Frequency Min 3X/week   Barriers to discharge        Co-evaluation               AM-PAC PT "6 Clicks" Mobility  Outcome Measure Help needed turning from your  back to your side while in a flat bed without using bedrails?: Total Help needed moving from lying on your back to sitting on the side of a flat bed without using bedrails?: Total Help needed moving to and from a bed to a chair (including a wheelchair)?: Total Help needed standing up from a chair using your arms (e.g., wheelchair or bedside chair)?: Total Help needed to walk in hospital room?: Total Help needed climbing 3-5 steps with a railing? : Total 6 Click Score: 6    End of Session   Activity Tolerance: Patient limited by fatigue;Treatment limited secondary to medical complications (Comment)(HR up to 150's) Patient left: in bed;with call bell/phone within reach;with nursing/sitter in room   PT Visit Diagnosis: Other abnormalities of gait and mobility (R26.89);Muscle weakness (generalized) (M62.81)    Time: 1620-1650 PT Time Calculation (min) (ACUTE ONLY): 30 min   Charges:   PT Evaluation $PT Eval High Complexity: 1 High PT Treatments $Therapeutic Activity: 8-22 mins        Sheran LawlessCyndi Wynn, [place] CarolinaPT Acute Rehabilitation Services [phone] 03/18/2019   Elray Mcgregorynthia Wynn 03/18/2019, 6:12 PM

## 2019-03-18 NOTE — Plan of Care (Signed)
  Problem: Education: Goal: Knowledge of General Education information will improve Description Including pain rating scale, medication(s)/side effects and non-pharmacologic comfort measures Outcome: Progressing   

## 2019-03-18 NOTE — Progress Notes (Signed)
Patient with abrasions right and Left upper quad of abdomin. Patient with Right and Left Knee abrasions.Left elbow and Left elbow abrasions. Right eye lacerations and Left Great toe abrasion. Wounds cleansed.

## 2019-03-18 NOTE — Consult Note (Signed)
Telepsych Consultation   Reason for Consult: ''Attempted suicide, still suicidal'' Referring Physician:  Dr. Erlinda Hong Location of Patient: [facility] Location of Provider: [facility]  Patient Identification: Clayton Baldwin MRN:  [id number] Principal Diagnosis: Major depressive disorder, recurrent episode, severe (HCC) Diagnosis:  Principal Problem:   Major depressive disorder, recurrent episode, severe (HCC) Active Problems:   Rhabdomyolysis   Suicide attempt (HCC)   Thrombocytopenia (HCC)   AKI (acute kidney injury) (HCC)   Hematoma of eyelid   Alcohol use disorder, severe, dependence (HCC)   Total Time spent with patient: 45 minutes  Subjective:   Clayton Baldwin is a 48 y.o. male patient admitted due to suicide attempt.  HPI:  Patient is a 48 year old male with a history of HTN, Alcohol use disorder, Major depression, and hepatic steatosis who presented with EMS after being found down at his home four days after he overdosed on Lorazepam, trazodone and alcohol in a suicide attempt. Patient reports that prior to attempting suicide he was overwhelmed, stressed out, severely depressed because he had a lot going on in his life. Reports that he has been depressed since he lost his job few years ago. He also reports being frustrated by his inability to stop drinking alcohol. Patient reports ongoing depression, worries, suicidal ideation but denies psychosis and delusional thinking. He is requesting to get help from alcohol and depression.  Past Psychiatric History: Depression, Alcohol use disorder  Risk to Self:  still suicidal Risk to Others:  denies Prior Inpatient Therapy:  denies Prior Outpatient Therapy:  PCP  Past Medical History:  Past Medical History:  Diagnosis Date  . Depression   . Hypertension   . Insomnia     Past Surgical History:  Procedure Laterality Date  . APPENDECTOMY     Family History:  Family History  Problem Relation Age of Onset   . Liver disease Other   . Colon cancer Neg Hx   . Colon polyps Neg Hx    Family Psychiatric  History:  Social History:  Social History   Substance and Sexual Activity  Alcohol Use Yes  . Alcohol/week: 0.0 standard drinks   Comment: couple of beers about 4 times per week      Social History   Substance and Sexual Activity  Drug Use No    Social History   Socioeconomic History  . Marital status: Single    Spouse name: Not on file  . Number of children: Not on file  . Years of education: Not on file  . Highest education level: Not on file  Occupational History  . Occupation: unemployed  Social Needs  . Financial resource strain: Not on file  . Food insecurity    Worry: Not on file    Inability: Not on file  . Transportation needs    Medical: Not on file    Non-medical: Not on file  Tobacco Use  . Smoking status: Never Smoker  . Tobacco comment: Never really smoked  Substance and Sexual Activity  . Alcohol use: Yes    Alcohol/week: 0.0 standard drinks    Comment: couple of beers about 4 times per week   . Drug use: No  . Sexual activity: Not on file  Lifestyle  . Physical activity    Days per week: Not on file    Minutes per session: Not on file  . Stress: Not on file  Relationships  . Social connections    Talks on phone: Not on file  Gets together: Not on file    Attends religious service: Not on file    Active member of club or organization: Not on file    Attends meetings of clubs or organizations: Not on file    Relationship status: Not on file  Other Topics Concern  . Not on file  Social History Narrative  . Not on file   Additional Social History:    Allergies:   Allergies  Allergen Reactions  . Grass Extracts [Gramineae Pollens] Itching and Other (See Comments)    Wheezing, also    Labs:  Results for orders placed or performed during the hospital encounter of 03/17/19 (from the past 48 hour(s))  Blood Culture (routine x 2)     Status:  None (Preliminary result)   Collection Time: 03/17/19  3:20 PM   Specimen: BLOOD  Result Value Ref Range   Specimen Description BLOOD RIGHT ANTECUBITAL    Special Requests      BOTTLES DRAWN AEROBIC AND ANAEROBIC Blood Culture results may not be optimal due to an inadequate volume of blood received in culture bottles   Culture      NO GROWTH < 24 HOURS Performed at [facility], [address], [place], [place] [address]    Report Status PENDING   Blood Culture (routine x 2)     Status: None (Preliminary result)   Collection Time: 03/17/19  3:25 PM   Specimen: BLOOD LEFT HAND  Result Value Ref Range   Specimen Description BLOOD LEFT HAND    Special Requests      BOTTLES DRAWN AEROBIC AND ANAEROBIC Blood Culture adequate volume   Culture      NO GROWTH < 24 HOURS Performed at [facility], [address], [place], [place] [address]    Report Status PENDING   Type and screen [facility]     Status: None   Collection Time: 03/17/19  3:26 PM  Result Value Ref Range   ABO/RH(D) A POS    Antibody Screen NEG    Sample Expiration      03/20/2019,2359 Performed at [facility], [address] [place], [place] [address]   ABO/Rh     Status: None   Collection Time: 03/17/19  3:26 PM  Result Value Ref Range   ABO/RH(D)      A POS Performed at [facility], [address] [place], [place] [address]   CDS serology     Status: None   Collection Time: 03/17/19  3:27 PM  Result Value Ref Range   CDS serology specimen      SPECIMEN WILL BE HELD FOR 14 DAYS IF TESTING IS REQUIRED    Comment: Performed at [facility], [address], [place], [place] [address]  Comprehensive metabolic panel     Status: Abnormal   Collection Time: 03/17/19  3:27 PM  Result Value Ref Range   Sodium 144 135 - 145 mmol/L   Potassium 3.9 3.5 - 5.1 mmol/L   Chloride 101 98 - 111 mmol/L   CO2 25 22 - 32 mmol/L   Glucose, Bld 147 (H) 70 - 99 mg/dL   BUN 46  (H) 6 - 20 mg/dL   Creatinine, Ser 1.30 (H) 0.61 - 1.24 mg/dL   Calcium 8.6 (L) 8.9 - 10.3 mg/dL   Total Protein 6.8 6.5 - 8.1 g/dL   Albumin 3.3 (L) 3.5 - 5.0 g/dL   AST 865 (H) 15 -  41 U/L   ALT 52 (H) 0 - 44 U/L   Alkaline Phosphatase 39 38 - 126 U/L   Total Bilirubin 3.6 (H) 0.3 - 1.2 mg/dL   GFR calc non Af Amer 31 (L) >60 mL/min   GFR calc Af Amer 37 (L) >60 mL/min   Anion gap 18 (H) 5 - 15    Comment: Performed at [facility], [address], [place], [place] [address]  CBC     Status: Abnormal   Collection Time: 03/17/19  3:27 PM  Result Value Ref Range   WBC 7.0 4.0 - 10.5 K/uL   RBC 4.57 4.22 - 5.81 MIL/uL   Hemoglobin 17.7 (H) 13.0 - 17.0 g/dL   HCT 98.9 21.1 - 94.1 %   MCV 113.1 (H) 80.0 - 100.0 fL   MCH 38.7 (H) 26.0 - 34.0 pg   MCHC 34.2 30.0 - 36.0 g/dL   RDW 74.0 81.4 - 48.1 %   Platelets 65 (L) 150 - 400 K/uL    Comment: REPEATED TO VERIFY PLATELET COUNT CONFIRMED BY SMEAR SPECIMEN CHECKED FOR CLOTS Immature Platelet Fraction may be clinically indicated, consider ordering this additional test [id number]    nRBC 0.0 0.0 - 0.2 %    Comment: Performed at [facility] Lab, [address], [place], [place] [address]  Ethanol     Status: None   Collection Time: 03/17/19  3:27 PM  Result Value Ref Range   Alcohol, Ethyl (B) <10 <10 mg/dL    Comment: (NOTE) Lowest detectable limit for serum alcohol is 10 mg/dL. For medical purposes only. Performed at [facility], [address] [place], [place] [address]   Urinalysis, Routine w reflex microscopic     Status: Abnormal   Collection Time: 03/17/19  3:27 PM  Result Value Ref Range   Color, Urine AMBER (A) YELLOW    Comment: BIOCHEMICALS MAY BE AFFECTED BY COLOR   APPearance CLOUDY (A) CLEAR   Specific Gravity, Urine 1.027 1.005 - 1.030   pH 5.0 5.0 - 8.0   Glucose, UA NEGATIVE NEGATIVE mg/dL   Hgb urine dipstick LARGE (A) NEGATIVE   Bilirubin Urine NEGATIVE NEGATIVE   Ketones, ur 20 (A)  NEGATIVE mg/dL   Protein, ur 027 (A) NEGATIVE mg/dL   Nitrite NEGATIVE NEGATIVE   Leukocytes,Ua NEGATIVE NEGATIVE   RBC / HPF 0-5 0 - 5 RBC/hpf   WBC, UA 6-10 0 - 5 WBC/hpf   Bacteria, UA RARE (A) NONE SEEN   Squamous Epithelial / LPF 0-5 0 - 5   WBC Clumps PRESENT    Mucus PRESENT    Hyaline Casts, UA PRESENT     Comment: Performed at [facility], [address], [place], [place] [address]  Lactic acid, plasma     Status: Abnormal   Collection Time: 03/17/19  3:27 PM  Result Value Ref Range   Lactic Acid, Venous 2.8 (HH) 0.5 - 1.9 mmol/L    Comment: CRITICAL RESULT CALLED TO, READ BACK BY AND VERIFIED WITH: Joylene Grapes RN AT 1604 ON [id number] BY Marveen Reeks Performed at [facility], [address] [place], [place] [address]   Protime-INR     Status: None   Collection Time: 03/17/19  3:27 PM  Result Value Ref Range   Prothrombin Time 13.4 11.4 - 15.2 seconds   INR 1.0 0.8 - 1.2    Comment: (NOTE) INR goal varies based on device and disease states. Performed at [facility], [address] [place], [place] [address]   CK total and CKMB     Status: Abnormal   Collection Time: 03/17/19  3:27 PM  Result Value Ref Range   Total CK 8,252 (H) 49 - 397 U/L    Comment: RESULTS CONFIRMED BY MANUAL DILUTION   CK, MB 13.6 (H) 0.5 - 5.0 ng/mL   Relative Index 0.2 0.0 - 2.5    Comment: Performed at [facility], [address], [place], [place] [address]  APTT     Status: None   Collection Time: 03/17/19  3:27 PM  Result Value Ref Range   aPTT 24 24 - 36 seconds    Comment: Performed at [facility], [address] [place], [place] [address]  Troponin I (High Sensitivity)     Status: Abnormal   Collection Time: 03/17/19  3:27 PM  Result Value Ref Range   Troponin I (High Sensitivity) 91 (H) <18 ng/L    Comment: (NOTE) Elevated high sensitivity troponin I (hsTnI) values and significant  changes across serial measurements may suggest ACS but many other   chronic and acute conditions are known to elevate hsTnI results.  Refer to the Links section for chest pain algorithms and additional  guidance. Performed at [facility], [address], [place], [place] [address]   Urine culture     Status: None (Preliminary result)   Collection Time: 03/17/19  3:34 PM   Specimen: In/Out Cath Urine  Result Value Ref Range   Specimen Description IN/OUT CATH URINE    Special Requests NONE    Culture      CULTURE REINCUBATED FOR BETTER GROWTH Performed at [facility], [address] [place], [place] [address]    Report Status PENDING   POC occult blood, ED RN will collect     Status: None   Collection Time: 03/17/19  3:39 PM  Result Value Ref Range   Fecal Occult Bld NEGATIVE NEGATIVE  SARS Coronavirus 2 [facility] order, Performed in [facility] lab) Nasopharyngeal Nasopharyngeal Swab     Status: None   Collection Time: 03/17/19  3:42 PM   Specimen: Nasopharyngeal Swab  Result Value Ref Range   SARS Coronavirus 2 NEGATIVE NEGATIVE    Comment: (NOTE) If result is NEGATIVE SARS-CoV-2 target nucleic acids are NOT DETECTED. The SARS-CoV-2 RNA is generally detectable in upper and lower  respiratory specimens during the acute phase of infection. The lowest  concentration of SARS-CoV-2 viral copies this assay can detect is 250  copies / mL. A negative result does not preclude SARS-CoV-2 infection  and should not be used as the sole basis for treatment or other  patient management decisions.  A negative result may occur with  improper specimen collection / handling, submission of specimen other  than nasopharyngeal swab, presence of viral mutation(s) within the  areas targeted by this assay, and inadequate number of viral copies  (<250 copies / mL). A negative result must be combined with clinical  observations, patient history, and epidemiological information. If result is POSITIVE SARS-CoV-2 target nucleic acids are  DETECTED. The SARS-CoV-2 RNA is generally detectable in upper and lower  respiratory specimens dur ing the acute phase of infection.  Positive  results are indicative of active infection with SARS-CoV-2.  Clinical  correlation with patient history and other diagnostic information is  necessary to determine patient infection status.  Positive results do  not rule out bacterial infection  or co-infection with other viruses. If result is PRESUMPTIVE POSTIVE SARS-CoV-2 nucleic acids MAY BE PRESENT.   A presumptive positive result was obtained on the submitted specimen  and confirmed on repeat testing.  While 2019 novel coronavirus  (SARS-CoV-2) nucleic acids may be present in the submitted sample  additional confirmatory testing may be necessary for epidemiological  and / or clinical management purposes  to differentiate between  SARS-CoV-2 and other Sarbecovirus currently known to infect humans.  If clinically indicated additional testing with an alternate test  methodology [phone] is advised. The SARS-CoV-2 RNA is generally  detectable in upper and lower respiratory sp ecimens during the acute  phase of infection. The expected result is Negative. Fact Sheet for Patients:  BoilerBrush.com.cyhttps://www.fda.gov/media/136312/download Fact Sheet for Healthcare Providers: https://pope.com/https://www.fda.gov/media/136313/download This test is not yet approved or cleared by the [place] FDA and has been authorized for detection and/or diagnosis of SARS-CoV-2 by FDA under an Emergency Use Authorization (EUA).  This EUA will remain in effect (meaning this test can be used) for the duration of the COVID-19 declaration under Section 564(b)(1) of the Act, 21 U.S.C. section 360bbb-3(b)(1), unless the authorization is terminated or revoked sooner. Performed at [facility], [address] [place], [place] [address]   I-stat chem 8, ED     Status: Abnormal   Collection Time: 03/17/19  4:09 PM  Result Value Ref Range    Sodium 143 135 - 145 mmol/L   Potassium 3.9 3.5 - 5.1 mmol/L   Chloride 105 98 - 111 mmol/L   BUN 50 (H) 6 - 20 mg/dL   Creatinine, Ser 8.112.00 (H) 0.61 - 1.24 mg/dL   Glucose, Bld 914138 (H) 70 - 99 mg/dL   Calcium, Ion 7.821.01 (L) 1.15 - 1.40 mmol/L   TCO2 26 22 - 32 mmol/L   Hemoglobin 17.7 (H) 13.0 - 17.0 g/dL   HCT 95.652.0 21.339.0 - 08.652.0 %  Troponin I (High Sensitivity)     Status: Abnormal   Collection Time: 03/17/19  5:35 PM  Result Value Ref Range   Troponin I (High Sensitivity) 85 (H) <18 ng/L    Comment: (NOTE) Elevated high sensitivity troponin I (hsTnI) values and significant  changes across serial measurements may suggest ACS but many other  chronic and acute conditions are known to elevate hsTnI results.  Refer to the "Links" section for chest pain algorithms and additional  guidance. Performed at [facility], [address] [place], [place] [address]   Magnesium     Status: None   Collection Time: 03/17/19  9:31 PM  Result Value Ref Range   Magnesium 1.9 1.7 - 2.4 mg/dL    Comment: Performed at [facility], [address] [place], [place] [address]  Phosphorus     Status: None   Collection Time: 03/17/19  9:31 PM  Result Value Ref Range   Phosphorus 3.3 2.5 - 4.6 mg/dL    Comment: Performed at [facility], [address] [place], [place] [address]  Protime-INR     Status: Abnormal   Collection Time: 03/17/19  9:31 PM  Result Value Ref Range   Prothrombin Time 15.7 (H) 11.4 - 15.2 seconds   INR 1.3 (H) 0.8 - 1.2    Comment: (NOTE) INR goal varies based on device and disease states. Performed at [facility], [address] [place], [place] [address]   APTT     Status: None   Collection Time: 03/17/19  9:31 PM  Result Value Ref Range   aPTT 24 24 - 36 seconds    Comment: Performed at [facility], [address] [place], [place] [address]  Basic metabolic panel     Status: Abnormal   Collection Time: 03/17/19  9:31 PM  Result  Value Ref Range   Sodium 145 135 - 145 mmol/L   Potassium 3.1 (L) 3.5 - 5.1 mmol/L    Comment: DELTA CHECK NOTED   Chloride 114 (H) 98 - 111 mmol/L   CO2 21 (L) 22 - 32 mmol/L   Glucose, Bld 115 (H) 70 - 99 mg/dL   BUN 38 (H) 6 - 20 mg/dL   Creatinine, Ser 6.04 (H) 0.61 - 1.24 mg/dL   Calcium 6.5 (L) 8.9 - 10.3 mg/dL    Comment: DELTA CHECK NOTED   GFR calc non Af Amer 51 (L) >60 mL/min   GFR calc Af Amer 59 (L) >60 mL/min   Anion gap 10 5 - 15    Comment: Performed at [facility], [address], [place], [place] [address]  Lactic acid, plasma     Status: None   Collection Time: 03/17/19  9:35 PM  Result Value Ref Range   Lactic Acid, Venous 1.4 0.5 - 1.9 mmol/L    Comment: Performed at [facility], [address], [place], [place] [address]  Lactic acid, plasma     Status: None   Collection Time: 03/18/19 12:04 AM  Result Value Ref Range   Lactic Acid, Venous 1.4 0.5 - 1.9 mmol/L    Comment: Performed at [facility], [address] [place], [place] [address]  CK     Status: Abnormal   Collection Time: 03/18/19 12:04 AM  Result Value Ref Range   Total CK 8,570 (H) 49 - 397 U/L    Comment: RESULTS CONFIRMED BY MANUAL DILUTION Performed at [facility], [address] [place], [place] [address]   Basic metabolic panel     Status: Abnormal   Collection Time: 03/18/19 12:04 AM  Result Value Ref Range   Sodium 144 135 - 145 mmol/L   Potassium 3.5 3.5 - 5.1 mmol/L   Chloride 109 98 - 111 mmol/L   CO2 22 22 - 32 mmol/L   Glucose, Bld 141 (H) 70 - 99 mg/dL   BUN 38 (H) 6 - 20 mg/dL   Creatinine, Ser 8.65 (H) 0.61 - 1.24 mg/dL   Calcium 7.6 (L) 8.9 - 10.3 mg/dL   GFR calc non Af Amer 50 (L) >60 mL/min   GFR calc Af Amer 58 (L) >60 mL/min   Anion gap 13 5 - 15    Comment: Performed at [facility], [address] [place], [place] [address]  Lipase, blood     Status: None   Collection Time: 03/18/19 12:04 AM  Result Value Ref Range   Lipase  51 11 - 51 U/L    Comment: Performed at [facility], [address] [place], [place] [address]  Save Smear     Status: None   Collection Time: 03/18/19 12:04 AM  Result Value Ref Range   Smear Review SMEAR STAINED AND AVAILABLE FOR REVIEW     Comment: Performed at [facility], [address], [place], [place] [address]  Urine rapid drug screen (hosp performed)     Status: Abnormal   Collection Time: 03/18/19  3:39 AM  Result Value Ref  Range   Opiates NONE DETECTED NONE DETECTED   Cocaine NONE DETECTED NONE DETECTED   Benzodiazepines NONE DETECTED NONE DETECTED   Amphetamines POSITIVE (A) NONE DETECTED   Tetrahydrocannabinol NONE DETECTED NONE DETECTED   Barbiturates NONE DETECTED NONE DETECTED    Comment: (NOTE) DRUG SCREEN FOR MEDICAL PURPOSES ONLY.  IF CONFIRMATION IS NEEDED FOR ANY PURPOSE, NOTIFY LAB WITHIN 5 DAYS. LOWEST DETECTABLE LIMITS FOR URINE DRUG SCREEN Drug Class                     Cutoff (ng/mL) Amphetamine and metabolites    1000 Barbiturate and metabolites    200 Benzodiazepine                 200 Tricyclics and metabolites     300 Opiates and metabolites        300 Cocaine and metabolites        300 THC                            50 Performed at [facility], [address], [place], [place] [address]   Comprehensive metabolic panel     Status: Abnormal   Collection Time: 03/18/19  4:21 AM  Result Value Ref Range   Sodium 145 135 - 145 mmol/L   Potassium 3.1 (L) 3.5 - 5.1 mmol/L   Chloride 109 98 - 111 mmol/L   CO2 22 22 - 32 mmol/L   Glucose, Bld 100 (H) 70 - 99 mg/dL   BUN 35 (H) 6 - 20 mg/dL   Creatinine, Ser 5.28 (H) 0.61 - 1.24 mg/dL   Calcium 7.7 (L) 8.9 - 10.3 mg/dL   Total Protein 5.0 (L) 6.5 - 8.1 g/dL   Albumin 2.3 (L) 3.5 - 5.0 g/dL   AST 413 (H) 15 - 41 U/L   ALT 41 0 - 44 U/L   Alkaline Phosphatase 32 (L) 38 - 126 U/L   Total Bilirubin 2.0 (H) 0.3 - 1.2 mg/dL   GFR calc non Af Amer >60 >60 mL/min   GFR calc Af  Amer >60 >60 mL/min   Anion gap 14 5 - 15    Comment: Performed at [facility] Lab, [address] [place], [place] [address]  CBC     Status: Abnormal   Collection Time: 03/18/19  4:21 AM  Result Value Ref Range   WBC 6.0 4.0 - 10.5 K/uL   RBC 3.87 (L) 4.22 - 5.81 MIL/uL   Hemoglobin 15.0 13.0 - 17.0 g/dL   HCT 02.7 25.3 - 66.4 %   MCV 112.4 (H) 80.0 - 100.0 fL   MCH 38.8 (H) 26.0 - 34.0 pg   MCHC 34.5 30.0 - 36.0 g/dL   RDW 40.3 47.4 - 25.9 %   Platelets 55 (L) 150 - 400 K/uL    Comment: REPEATED TO VERIFY SPECIMEN CHECKED FOR CLOTS Immature Platelet Fraction may be clinically indicated, consider ordering this additional test [id number] CONSISTENT WITH PREVIOUS RESULT    nRBC 0.0 0.0 - 0.2 %    Comment: Performed at [facility], [address], [place], [place] [address]  CK     Status: Abnormal   Collection Time: 03/18/19  4:21 AM  Result Value Ref Range   Total CK 7,250 (H) 49 - 397 U/L    Comment: RESULTS CONFIRMED BY MANUAL DILUTION Performed at [facility], [address], [place],  [place] [address]   Ferritin     Status: Abnormal   Collection Time: 03/18/19  4:21 AM  Result Value Ref Range   Ferritin 889 (H) 24 - 336 ng/mL    Comment: Performed at [facility], [address] [place], [place] [address]  Vitamin B12     Status: None   Collection Time: 03/18/19  4:21 AM  Result Value Ref Range   Vitamin B-12 370 180 - 914 pg/mL    Comment: (NOTE) This assay is not validated for testing neonatal or myeloproliferative syndrome specimens for Vitamin B12 levels. Performed at [facility], [address] [place], [place] [address]   Iron and TIBC     Status: Abnormal   Collection Time: 03/18/19  4:21 AM  Result Value Ref Range   Iron 29 (L) 45 - 182 ug/dL   TIBC 914 (L) 782 - 956 ug/dL   Saturation Ratios 22 17.9 - 39.5 %   UIBC 104 ug/dL    Comment: Performed at [facility], [address] [place], [place] [address]   Glucose, capillary     Status: Abnormal   Collection Time: 03/18/19  6:42 AM  Result Value Ref Range   Glucose-Capillary 102 (H) 70 - 99 mg/dL   Comment 1 Notify RN   Basic metabolic panel     Status: Abnormal   Collection Time: 03/18/19  8:02 AM  Result Value Ref Range   Sodium 146 (H) 135 - 145 mmol/L   Potassium 3.6 3.5 - 5.1 mmol/L   Chloride 114 (H) 98 - 111 mmol/L   CO2 22 22 - 32 mmol/L   Glucose, Bld 101 (H) 70 - 99 mg/dL   BUN 33 (H) 6 - 20 mg/dL   Creatinine, Ser 6.57 (H) 0.61 - 1.24 mg/dL   Calcium 7.7 (L) 8.9 - 10.3 mg/dL   GFR calc non Af Amer >60 >60 mL/min   GFR calc Af Amer >60 >60 mL/min   Anion gap 10 5 - 15    Comment: Performed at [facility], [address], [place], [place] [address]  Glucose, capillary     Status: None   Collection Time: 03/18/19  8:04 AM  Result Value Ref Range   Glucose-Capillary 94 70 - 99 mg/dL  Acetaminophen level     Status: Abnormal   Collection Time: 03/18/19 10:10 AM  Result Value Ref Range   Acetaminophen (Tylenol), Serum <10 (L) 10 - 30 ug/mL    Comment: (NOTE) Therapeutic concentrations vary significantly. A range of 10-30 ug/mL  may be an effective concentration for many patients. However, some  are best treated at concentrations outside of this range. Acetaminophen concentrations >150 ug/mL at 4 hours after ingestion  and >50 ug/mL at 12 hours after ingestion are often associated with  toxic reactions. Performed at [facility] Lab, [address], [place], [place] [address]   Basic metabolic panel     Status: Abnormal   Collection Time: 03/18/19 12:02 PM  Result Value Ref Range   Sodium 144 135 - 145 mmol/L   Potassium 3.6 3.5 - 5.1 mmol/L   Chloride 113 (H) 98 - 111 mmol/L   CO2 24 22 - 32 mmol/L   Glucose, Bld 108 (H) 70 - 99 mg/dL   BUN 31 (H) 6 - 20 mg/dL   Creatinine, Ser 4.13 0.61 - 1.24 mg/dL   Calcium 7.4 (L) 8.9 - 10.3 mg/dL   GFR calc non Af  Amer >60 >60 mL/min   GFR calc Af Amer >60 >60  mL/min   Anion gap 7 5 - 15    Comment: Performed at [facility] [address] [place], [place] [address]    Medications:  Current Facility-Administered Medications  Medication Dose Route Frequency Provider Last Rate Last Dose  . 0.9 %  sodium chloride infusion   Intravenous Continuous Kathi Ludwig, MD 150 mL/hr at 03/18/19 1429    . acetaminophen (TYLENOL) tablet 650 mg  650 mg Oral Q6H PRN Seawell, Jaimie A, DO      . erythromycin ophthalmic ointment   Right Eye QID Hortencia Pilar, MD      . folic acid (FOLVITE) tablet 1 mg  1 mg Oral Daily Seawell, Jaimie A, DO   1 mg at 03/18/19 0907  . labetalol (NORMODYNE) injection 5 mg  5 mg Intravenous Once PRN Seawell, Jaimie A, DO      . LORazepam (ATIVAN) tablet 1 mg  1 mg Oral Q6H PRN Seawell, Jaimie A, DO       Or  . LORazepam (ATIVAN) injection 1 mg  1 mg Intravenous Q6H PRN Seawell, Jaimie A, DO      . multivitamin with minerals tablet 1 tablet  1 tablet Oral Daily Seawell, Jaimie A, DO   1 tablet at 03/18/19 0907  . [START ON 03/19/2019] pneumococcal 23 valent vaccine (PNU-IMMUNE) injection 0.5 mL  0.5 mL Intramuscular Tomorrow-1000 Axel Filler, MD      . thiamine (VITAMIN B-1) tablet 100 mg  100 mg Oral Daily Seawell, Jaimie A, DO   100 mg at 03/18/19 0907   Or  . thiamine (B-1) injection 100 mg  100 mg Intravenous Daily Seawell, Jaimie A, DO      . vitamin B-12 (CYANOCOBALAMIN) tablet 1,000 mcg  1,000 mcg Oral Daily Earlene Plater, MD   1,000 mcg at 03/18/19 1118    Musculoskeletal: Strength & Muscle Tone: not tested Gait & Station: not tested Patient leans: N/A  Psychiatric Specialty Exam: Physical Exam  Psychiatric: His speech is normal. He is slowed and withdrawn. Cognition and memory are normal. He expresses impulsivity. He exhibits a depressed mood. He expresses suicidal ideation. He expresses suicidal plans.    Review of Systems  Constitutional: Negative.   HENT: Negative.   Eyes:  Negative.   Respiratory: Negative.   Cardiovascular: Negative.   Gastrointestinal: Negative.   Skin: Negative.   Neurological: Positive for tremors.  Psychiatric/Behavioral: Positive for depression, substance abuse and suicidal ideas. The patient has insomnia.     Blood pressure (!) 146/97, pulse 88, temperature 98.4 F (36.9 C), temperature source Oral, resp. rate 17, height 6' (1.829 m), weight 97.5 kg, SpO2 99 %.Body mass index is 29.16 kg/m.  General Appearance: Casual  Eye Contact:  Good  Speech:  Clear and Coherent  Volume:  Decreased  Mood:  Depressed  Affect:  Constricted  Thought Process:  Coherent and Linear  Orientation:  Full (Time, Place, and Person)  Thought Content:  Logical  Suicidal Thoughts:  Yes.  without intent/plan  Homicidal Thoughts:  No  Memory:  Immediate;   Good Recent;   Good Remote;   Good  Judgement:  Poor  Insight:  Shallow  Psychomotor Activity:  Psychomotor Retardation  Concentration:  Concentration: Fair and Attention Span: Fair  Recall:  Good  Fund of Knowledge:  Good  Language:  Good  Akathisia:  No  Handed:  Right  AIMS (if indicated):  Assets:  Communication Skills Desire for Improvement  ADL's:  Intact  Cognition:  WNL  Sleep:   poor     Treatment Plan Summary: 48 year old male with a pertinent past medical history of hypertension, Major depression, Alcohol use disorder-severe and insomnia who was admitted after he attempted suicide by overdosing on Lorazepam, Trazodone and Alcohol. Patient continues to verbalize suicide thoughts and unable to contract for safety. He will benefit from inpatient psychiatric admission to address alcoholism and severe depression after he is medically stable.  Recommendations. -Continue 1:1 sitter for safety -Continue CIWA and Lorazepam protocol -Do not start antidepressant or anti-psychotic until CK level is below 3000. -Consider social worker consult to facilitate inpatient psychiatric placement  after patient is medically cleared.  Disposition: Recommend psychiatric Inpatient admission when medically cleared. Supportive therapy provided about ongoing stressors. Psychiatric service signing out. Re-consult psych as needed  This service was provided via telemedicine using a 2-way, interactive audio and video technology.  Names of all persons participating in this telemedicine service and their role in this encounter. Name: Vanessa RalphsBenjamin Baldwin Role: Patient  Name: Thedore MinsMojeed Matt Delpizzo, MD Role: Psychiatrist  Name:  Role:   Name:  Role:     Thedore MinsMojeed Perl Folmar, MD 03/18/2019 2:49 PM

## 2019-03-18 NOTE — Consult Note (Addendum)
CC:  Chief Complaint  Patient presents with  . Fall    HPI: Clayton Baldwin is a 48 y.o. 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  Symptoms started yesterday.   Ophthalmology was consulted to evaluate right periorbital bruising and hemorrhage.   ROS: Denies fever/chills, unintentional weight loss, chest pain, irregular heart rhythm, SOB, cough, wheezing, abdominal pain, melena, hematochezia, weakness, numbness, slurring of speech, facial droop, muscle weakness, joint pain, skin rash, tattoos, depressed mood  PMH: Past Medical History:  Diagnosis Date  . Depression   . Hypertension   . Insomnia     PSH: Past Surgical History:  Procedure Laterality Date  . APPENDECTOMY      Meds: No current facility-administered medications on file prior to encounter.    Current Outpatient Medications on File Prior to Encounter  Medication Sig Dispense Refill  . cetirizine (KLS ALLER-TEC) 10 MG tablet Take 10 mg by mouth daily.    Marland Kitchen ibuprofen (ADVIL) 200 MG tablet Take 400 mg by mouth every 6 (six) hours as needed for headache or mild pain.    . NON FORMULARY Take 1 capsule by mouth See admin instructions. [employer] Adult 50+ Softgels- Take 1 softgel by mouth once a day    . traZODone (DESYREL) 150 MG tablet Take 150 mg by mouth at bedtime as needed for sleep.     Marland Kitchen allopurinol (ZYLOPRIM) 100 MG tablet Take 100 mg by mouth daily.    Marland Kitchen amLODipine (NORVASC) 10 MG tablet Take 10 mg by mouth daily.    Marland Kitchen escitalopram (LEXAPRO) 20 MG tablet Take 20 mg by mouth daily.    Marland Kitchen olmesartan (BENICAR) 40 MG tablet Take 40 mg by mouth daily.    . propranolol (INNOPRAN XL) 80 MG 24 hr capsule Take 80 mg by mouth at bedtime.      SH: Social History   Socioeconomic History  . Marital status: Single    Spouse name: Not on file  . Number of children: Not on file  . Years of education: Not on file  .  Highest education level: Not on file  Occupational History  . Occupation: unemployed  Social Needs  . Financial resource strain: Not on file  . Food insecurity    Worry: Not on file    Inability: Not on file  . Transportation needs    Medical: Not on file    Non-medical: Not on file  Tobacco Use  . Smoking status: Never Smoker  . Tobacco comment: Never really smoked  Substance and Sexual Activity  . Alcohol use: Yes    Alcohol/week: 0.0 standard drinks    Comment: couple of beers about 4 times per week   . Drug use: No  . Sexual activity: Not on file  Lifestyle  . Physical activity    Days per week: Not on file    Minutes per session: Not on file  . Stress: Not on file  Relationships  . Social Herbalist on phone: Not on file    Gets together: Not on file    Attends religious service: Not on file    Active member of club or organization: Not on file    Attends meetings of clubs or organizations: Not on file    Relationship status: Not on file  Other Topics Concern  . Not on file  Social History Narrative  . Not on  file    FH: Family History  Problem Relation Age of Onset  . Liver disease Other   . Colon cancer Neg Hx   . Colon polyps Neg Hx     Exam:  Zenaida NieceVan: OD: 20/50 equivalent OS: 20/50 equivalent  CVF: OD: full OS: full  EOM: OD: limited temporally due to hematoma OS: full d/v  Pupils: OD: 3->2 mm, no APD OS: 3->2 mm, no APD  IOP: by [place] OD:13 OS: 20 (squeezing)  External: OD: 3+ periorbital upper lid edema, superficial abrasion on the lid, good orbicularis strength OS: no periorbital edema, no proptosis, V1-V3 intact and symmetric, good orbicularis strength  Pen Light Exam: L/L: OD: 2+ upper lid edema, blepharitis, no eyelid laceration OS: blepharitis  C/S: OD: inferotemporal conjunctival hematoma with subconj heme extending inferiorly to nasally - bullous subconj heme superiorly OS: white and quiet  K: OD: Temporal  epithelial defect, seidel negative OS: clear, no abnormal staining  A/C: OD: grossly deep and quiet appearing by pen light OS: grossly deep and quiet appearing by pen light  I: OD: round and regular, no peaked/irregular pupil OS: round and regular  L: OD: Trace NSC OS: Trace NSC  DFE: dilated @ 10:25 w/ Tropic and Phenyl OU  V: OD: clear OS: clear  N: OD: C/D 0.45, no disc edema OS: C/D 0.45, no disc edema  M: OD: flat, no obvious macular pathology OS: flat, no obvious macular pathology  V: OD: normal appearing vessels OS: normal appearing vessels  P: OD: retina flat 360, no obvious mass/RT/RD OS: retina flat 360, no obvious mass/RT/RD  CT Orbit (03/17/19). Osseous: No fracture or mandibular dislocation. No destructive process.  Orbits: Negative. No traumatic or inflammatory finding.  Sinuses: Clear.  Soft tissues: There is right periorbital soft tissue swelling.   A/P:  1. Subconjunctival Hematomas with Subconj Heme - Will order erythromycin ointment QID OD x 5 days - Will also help with corneal epithelial defect OD - No evidence of ruptured globe, although will need to monitor given bullous sub conj heme - Hematoma and sub conj heme will resolve over the next 1-2 weeks - I will follow in 1-2 days, sooner with any concerns.  - CT Orbit shows no evidence of ruptured globe and no orbital fractures.  2. Corneal Epithelial Defect OD - Lid edema causing good closure, no need to tape shut - Erythromycin ung QID OD )po  3. 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 Please call with any other concerns or issues.   Wynell Balloonhristopher , MD,MPH Ophthalmology [phone]

## 2019-03-19 ENCOUNTER — Inpatient Hospital Stay (HOSPITAL_COMMUNITY): Payer: Self-pay

## 2019-03-19 DIAGNOSIS — R29898 Other symptoms and signs involving the musculoskeletal system: Secondary | ICD-10-CM

## 2019-03-19 DIAGNOSIS — R609 Edema, unspecified: Secondary | ICD-10-CM

## 2019-03-19 DIAGNOSIS — M6281 Muscle weakness (generalized): Secondary | ICD-10-CM

## 2019-03-19 DIAGNOSIS — H1131 Conjunctival hemorrhage, right eye: Secondary | ICD-10-CM

## 2019-03-19 LAB — CK
Total CK: 8701 U/L — ABNORMAL HIGH (ref 49–397)
Total CK: 8071 U/L — ABNORMAL HIGH (ref 49–397)

## 2019-03-19 LAB — GLUCOSE, CAPILLARY
Glucose-Capillary: 96 mg/dL (ref 70–99)
Glucose-Capillary: 98 mg/dL (ref 70–99)

## 2019-03-19 LAB — BASIC METABOLIC PANEL
Anion gap: 8 (ref 5–15)
Anion gap: 9 (ref 5–15)
BUN: 19 mg/dL (ref 6–20)
BUN: 15 mg/dL (ref 6–20)
CO2: 20 mmol/L — ABNORMAL LOW (ref 22–32)
CO2: 21 mmol/L — ABNORMAL LOW (ref 22–32)
Calcium: 7.7 mg/dL — ABNORMAL LOW (ref 8.9–10.3)
Calcium: 7.7 mg/dL — ABNORMAL LOW (ref 8.9–10.3)
Chloride: 111 mmol/L (ref 98–111)
Chloride: 108 mmol/L (ref 98–111)
Creatinine, Ser: 0.99 mg/dL (ref 0.61–1.24)
Creatinine, Ser: 0.8 mg/dL (ref 0.61–1.24)
GFR calc Af Amer: >60 mL/min (ref >60)
GFR calc Af Amer: >60 mL/min (ref >60)
GFR calc non Af Amer: >60 mL/min (ref >60)
GFR calc non Af Amer: >60 mL/min (ref >60)
Glucose, Bld: 131 mg/dL — ABNORMAL HIGH (ref 70–99)
Glucose, Bld: 97 mg/dL (ref 70–99)
Potassium: 3.5 mmol/L (ref 3.5–5.1)
Potassium: 3.4 mmol/L — ABNORMAL LOW (ref 3.5–5.1)
Sodium: 139 mmol/L (ref 135–145)
Sodium: 138 mmol/L (ref 135–145)

## 2019-03-19 LAB — CBC
HCT: 34.7 % — ABNORMAL LOW (ref 39.0–52.0)
Hemoglobin: 12 g/dL — ABNORMAL LOW (ref 13.0–17.0)
MCH: 38.5 pg — ABNORMAL HIGH (ref 26.0–34.0)
MCHC: 34.6 g/dL (ref 30.0–36.0)
MCV: 111.2 fL — ABNORMAL HIGH (ref 80.0–100.0)
Platelets: 69 10*3/uL — ABNORMAL LOW (ref 150–400)
RBC: 3.12 MIL/uL — ABNORMAL LOW (ref 4.22–5.81)
RDW: 12.1 % (ref 11.5–15.5)
WBC: 5.4 10*3/uL (ref 4.0–10.5)
nRBC: 0 % (ref 0.0–0.2)

## 2019-03-19 LAB — HEPATIC FUNCTION PANEL
ALT: 59 U/L — ABNORMAL HIGH (ref 0–44)
AST: 289 U/L — ABNORMAL HIGH (ref 15–41)
Albumin: 2 g/dL — ABNORMAL LOW (ref 3.5–5.0)
Alkaline Phosphatase: 27 U/L — ABNORMAL LOW (ref 38–126)
Bilirubin, Direct: 0.5 mg/dL — ABNORMAL HIGH (ref 0.0–0.2)
Indirect Bilirubin: 0.9 mg/dL (ref 0.3–0.9)
Total Bilirubin: 1.4 mg/dL — ABNORMAL HIGH (ref 0.3–1.2)
Total Protein: 4.5 g/dL — ABNORMAL LOW (ref 6.5–8.1)

## 2019-03-19 LAB — CALCIUM, IONIZED: Calcium, Ionized, Serum: 4.4 mg/dL — ABNORMAL LOW (ref 4.5–5.6)

## 2019-03-19 LAB — HIV ANTIBODY (ROUTINE TESTING W REFLEX): HIV Screen 4th Generation wRfx: NONREACTIVE

## 2019-03-19 MED ORDER — POTASSIUM CHLORIDE CRYS ER 20 MEQ PO TBCR
40.0000 meq | EXTENDED_RELEASE_TABLET | Freq: Once | ORAL | Status: AC
  Administered 2019-03-19: 20:00:00 40 meq via ORAL
  Filled 2019-03-19: qty 2

## 2019-03-19 MED ORDER — PNEUMOCOCCAL VAC POLYVALENT 25 MCG/0.5ML IJ INJ
0.5000 mL | INJECTION | Freq: Once | INTRAMUSCULAR | Status: DC
  Filled 2019-03-19: qty 0.5

## 2019-03-19 MED ORDER — CALCIUM CARBONATE ANTACID 500 MG PO CHEW
2.0000 | CHEWABLE_TABLET | Freq: Three times a day (TID) | ORAL | Status: DC | PRN
  Administered 2019-03-19: 400 mg via ORAL
  Filled 2019-03-19: qty 2

## 2019-03-19 NOTE — Progress Notes (Addendum)
NEURO HOSPITALIST PROGRESS NOTE   Subjective: Patient awake, alert, sitter at bedside. NAD  Exam: Vitals:   03/19/19 0315 03/19/19 0531  BP: (!) 145/95 (!) 135/94  Pulse: 75 81  Resp: 15 17  Temp: 98.4 F (36.9 C)   SpO2: 99% 99%    Physical Exam   HEENT-  Normocephalic, no lesions, without obvious abnormality.  Right eye edematous, bruised and erythematous Cardiovascular- S1-S2 audible, pulses palpable everywhere but LUE.  Lungs-no excessive working breathing.  Saturations within normal limits Abdomen- All 4 quadrants palpated and nontender Extremities- Warm, dry and intact Musculoskeletal-LUE edematous Skin-warm and dry, no hyperpigmentation, vitiligo, or suspicious lesions   Neuro:  Mental Status: Alert, oriented, thought content appropriate.  Speech fluent without evidence of aphasia.  Able to follow  commands without difficulty. Cranial Nerves: II:  Visual fields grossly normal, right eye is slower to focus. III,IV, VI: ptosis not present,right eye lid edematous and bruised,  extra-ocular motions intact bilaterally pupils equal, round, reactive to light and accommodation V,VII: smile symmetric, facial light touch sensation normal bilaterally VIII: hearing normal bilaterally IX,X: uvula rises symmetrically XI: bilateral shoulder shrug XII: midline tongue extension Motor: Right : Upper extremity   5/5  Left:     Upper extremity   0/5 does not break gravity, unable to grip hand on left, 3/5 for shoulder shrug on left.   Lower extremity   5/5    Lower extremity   5/5 Tone and bulk:normal tone throughout; no atrophy noted Sensory:light touch intact throughout, RUE and BLE. LUE patient states he can not feel touch Deep Tendon Reflexes: 2+ brisk BLE, unable to elicit reflex in LUE RUE +2 biceps and bracioradialis Plantars: Right: downgoing   Left: downgoing Cerebellar: normal finger-to-nose, normal rapid alternating movements and normal heel-to-shin  test Gait: deferred    Medications:  Scheduled: . erythromycin   Right Eye QID  . folic acid  1 mg Oral Daily  . multivitamin with minerals  1 tablet Oral Daily  . [START ON 03/20/2019] pneumococcal 23 valent vaccine  0.5 mL Intramuscular Once  . thiamine  100 mg Oral Daily   Or  . thiamine  100 mg Intravenous Daily  . vitamin B-12  1,000 mcg Oral Daily   Continuous: . sodium chloride 150 mL/hr at 03/18/19 2145   UDJ:[id number], labetalol, LORazepam **OR** LORazepam  Pertinent Labs/Diagnostics:   Dg Chest 1 View  Result Date: 03/17/2019 CLINICAL DATA:  Fall EXAM: CHEST  1 VIEW COMPARISON:  08/16/2010 FINDINGS: The heart size and mediastinal contours are within normal limits. Both lungs are clear. The visualized skeletal structures are unremarkable. IMPRESSION: No active disease. Electronically Signed   By: Donavan Foil M.D.   On: 03/17/2019 16:33   Dg Pelvis 1-2 Views  Result Date: 03/17/2019 CLINICAL DATA:  Fall EXAM: PELVIS - 1-2 VIEW COMPARISON:  None. FINDINGS: There is no evidence of pelvic fracture or diastasis. No pelvic bone lesions are seen. IMPRESSION: Negative. Electronically Signed   By: Donavan Foil M.D.   On: 03/17/2019 16:33   Dg Elbow Complete Left  Result Date: 03/17/2019 CLINICAL DATA:  Fall with elbow abrasion EXAM: LEFT ELBOW - COMPLETE 3+ VIEW COMPARISON:  None. FINDINGS: No fracture or malalignment. No significant elbow effusion. Protuberant soft tissue swelling dorsal aspect of the proximal forearm. IMPRESSION: No acute osseous abnormality Electronically Signed   By: Madie Reno.D.  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 Electronically Signed   By: Katherine Mantlehristopher  Green M.D.   On: 03/17/2019 18:08   Result Date: 03/17/2019 CLINICAL DATA:  Acute pain due to trauma. Hematoma to  the right eye. EXAM: CT HEAD WITHOUT CONTRAST CT MAXILLOFACIAL WITHOUT CONTRAST CT CERVICAL SPINE WITHOUT CONTRAST CT CHEST, ABDOMEN AND PELVIS WITH CONTRAST TECHNIQUE: Contiguous axial images were obtained from the base of the skull through the vertex without intravenous contrast. Multidetector CT imaging of the maxillofacial structures was performed. Multiplanar CT image reconstructions were also generated. A small metallic BB was placed on the right temple in order to reliably differentiate right from left. Multidetector CT imaging of the cervical spine was performed without intravenous contrast. Multiplanar CT image reconstructions were also generated. Multidetector CT imaging of the chest, abdomen and pelvis was performed following the standard protocol during bolus administration of intravenous contrast. CONTRAST:  80mL OMNIPAQUE IOHEXOL 300 MG/ML  SOLN COMPARISON:  CT head dated 08/16/2010 FINDINGS: CT HEAD: Brain: No evidence of acute infarction, hemorrhage, hydrocephalus, extra-axial collection or mass lesion/mass effect. There is advanced atrophy, greater than expected for the patient's stated age. Vascular: No hyperdense vessel or unexpected calcification. Skull: Normal. Negative for fracture or focal lesion. There is right frontal scalp swelling. Other: None. CT MAXILLOFACIAL FINDINGS Osseous: No fracture or mandibular dislocation. No destructive process. Orbits: Negative. No traumatic or inflammatory finding. Sinuses: Clear. Soft tissues: There is right periorbital soft tissue swelling. CT CERVICAL SPINE FINDINGS Alignment: Normal. Skull base and vertebrae: No acute fracture. No primary bone lesion or focal pathologic process. 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 There are multilevel degenerative changes throughout the cervical spine, greatest at the C5-C6 level. There is osseous neural foraminal narrowing at the C4-C5 and C5-C6 levels bilaterally. Soft tissues and spinal canal: No  prevertebral fluid or swelling. No visible canal hematoma. Disc levels: Multilevel disc height loss is noted throughout the cervical spine. Other: None. CT CHEST FINDINGS Cardiovascular: The thoracic aorta is unremarkable. Coronary artery calcifications are noted. The heart size is normal. There is no significant pericardial effusion. Mediastinum/Nodes: --No mediastinal or hilar lymphadenopathy. --No axillary lymphadenopathy. --No supraclavicular lymphadenopathy. --Normal thyroid gland. --The esophagus is unremarkable Lungs/Pleura: There is some mild atelectasis at the lung bases and within the right middle lobe. There is no pneumothorax. No significant pleural effusion. Musculoskeletal: No chest wall abnormality. No acute or significant osseous findings. CT ABDOMEN AND PELVIS FINDINGS Hepatobiliary: There is decreased hepatic attenuation suggestive of hepatic steatosis. Normal gallbladder.There is no biliary ductal dilation. Pancreas: Normal contours without ductal dilatation. No peripancreatic fluid collection. Spleen: No splenic laceration or hematoma. Adrenals/Urinary Tract: --Adrenal glands: No adrenal hemorrhage. 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 There are no radiopaque kidney stones. --Urinary bladder: There is a focus of gas within the urinary bladder. Stomach/Bowel: --Stomach/Duodenum: No hiatal hernia or other gastric abnormality. Normal duodenal course and caliber. --Small bowel: No dilatation or inflammation. --Colon: There is scattered colonic diverticula without CT evidence for diverticulitis. --Appendix: Not visualized. No right lower quadrant inflammation or free fluid. Vascular/Lymphatic: Normal course and caliber of the major abdominal vessels. --No retroperitoneal lymphadenopathy. --No mesenteric lymphadenopathy. --No pelvic or inguinal lymphadenopathy. Reproductive: Unremarkable Other: No ascites or free air. The abdominal wall is normal. Musculoskeletal. There  appears to be some mild  height loss of the T11 vertebral body, likely chronic. There is an age indeterminate fracture of the L3 transverse process on the right. This is favored to be chronic. IMPRESSION: 1. No acute intracranial process. 2. No acute cervical spine fracture or dislocation. 3. Right frontal scalp soft tissue swelling. 4. No acute maxillofacial fracture. 5. No acute intra-abdominal or pelvic injury. 6. Hepatic steatosis. 7. Horseshoe kidney. 8. Colonic diverticulosis without CT evidence for diverticulitis. 9. There is a chronic appearing L3 transverse process fracture on the right. 10. Focus of gas within the urinary bladder. Correlation with urinalysis and patient history is recommended. This may be secondary to prior instrumentation or an infection with a gas-forming organism. Electronically Signed: By: Katherine Mantle M.D. 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 Electronically Signed   By: Katherine Mantle M.D.   On: 03/17/2019 18:08   Result Date: 03/17/2019 CLINICAL DATA:  Acute pain due to trauma. Hematoma to the right eye. EXAM: CT HEAD WITHOUT CONTRAST CT MAXILLOFACIAL WITHOUT CONTRAST CT CERVICAL SPINE WITHOUT CONTRAST CT CHEST, ABDOMEN AND PELVIS WITH CONTRAST TECHNIQUE: Contiguous axial images were obtained from the base of the skull through the vertex without intravenous contrast. Multidetector CT imaging of the maxillofacial structures was performed. Multiplanar CT image reconstructions were also generated. A small metallic BB was placed on the right temple in order to reliably differentiate right from left. Multidetector CT imaging of the cervical spine was performed without intravenous contrast. Multiplanar CT image reconstructions were also generated. Multidetector CT imaging of the chest, abdomen  and pelvis was performed following the standard protocol during bolus administration of intravenous contrast. CONTRAST:  80mL OMNIPAQUE IOHEXOL 300 MG/ML  SOLN COMPARISON:  CT head dated 08/16/2010 FINDINGS: CT HEAD: Brain: No evidence of acute infarction, hemorrhage, hydrocephalus, extra-axial collection or mass lesion/mass effect. There is advanced atrophy, greater than expected for the patient's stated age. Vascular: No hyperdense vessel or unexpected calcification. Skull: Normal. Negative for fracture or focal lesion. There is right frontal scalp swelling. Other: None. CT MAXILLOFACIAL FINDINGS Osseous: No fracture or mandibular dislocation. No destructive process. Orbits: Negative. No traumatic or inflammatory finding. Sinuses: Clear. Soft tissues: There is right periorbital soft tissue swelling. CT CERVICAL SPINE FINDINGS Alignment: Normal. Skull base and vertebrae: No acute fracture. No primary bone lesion or focal pathologic process. 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 There are multilevel degenerative changes throughout the cervical spine, greatest at the C5-C6 level. There is osseous neural foraminal narrowing at the C4-C5 and C5-C6 levels bilaterally. Soft tissues and spinal canal: No prevertebral fluid or swelling. No visible canal hematoma. Disc levels: Multilevel disc height loss is noted throughout the cervical spine. Other: None. CT CHEST FINDINGS Cardiovascular: The thoracic aorta is unremarkable. Coronary artery calcifications are noted. The heart size is normal. There is no significant pericardial effusion. Mediastinum/Nodes: --No mediastinal or hilar lymphadenopathy. --No axillary lymphadenopathy. --No supraclavicular lymphadenopathy. --Normal thyroid gland. --The esophagus is unremarkable Lungs/Pleura: There is some mild atelectasis at the lung bases and within the right middle lobe. There is no pneumothorax. No significant pleural effusion. Musculoskeletal: No chest wall  abnormality. No acute or significant osseous findings. CT ABDOMEN AND PELVIS FINDINGS Hepatobiliary: There is decreased hepatic attenuation suggestive of hepatic steatosis. Normal gallbladder.There is no biliary ductal dilation. Pancreas: Normal contours without ductal dilatation. No peripancreatic  fluid collection. Spleen: No splenic laceration or hematoma. Adrenals/Urinary Tract: --Adrenal glands: No adrenal hemorrhage. 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 There are no radiopaque kidney stones. --Urinary bladder: There is a focus of gas within the urinary bladder. Stomach/Bowel: --Stomach/Duodenum: No hiatal hernia or other gastric abnormality. Normal duodenal course and caliber. --Small bowel: No dilatation or inflammation. --Colon: There is scattered colonic diverticula without CT evidence for diverticulitis. --Appendix: Not visualized. No right lower quadrant inflammation or free fluid. Vascular/Lymphatic: Normal course and caliber of the major abdominal vessels. --No retroperitoneal lymphadenopathy. --No mesenteric lymphadenopathy. --No pelvic or inguinal lymphadenopathy. Reproductive: Unremarkable Other: No ascites or free air. The abdominal wall is normal. Musculoskeletal. There appears to be some mild height loss of the T11 vertebral body, likely chronic. There is an age indeterminate fracture of the L3 transverse process on the right. This is favored to be chronic. IMPRESSION: 1. No acute intracranial process. 2. No acute cervical spine fracture or dislocation. 3. Right frontal scalp soft tissue swelling. 4. No acute maxillofacial fracture. 5. No acute intra-abdominal or pelvic injury. 6. Hepatic steatosis. 7. Horseshoe kidney. 8. Colonic diverticulosis without CT evidence for diverticulitis. 9. There is a chronic appearing L3 transverse process fracture on the right. 10. Focus of gas within the urinary bladder. Correlation with urinalysis and patient history is recommended.  This may be secondary to prior instrumentation or an infection with a gas-forming organism. Electronically Signed: By: Katherine Mantle M.D. 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 Electronically Signed   By: Katherine Mantle M.D.   On: 03/17/2019 18:08   Result Date: 03/17/2019 CLINICAL DATA:  Acute pain due to trauma. Hematoma to the right eye. EXAM: CT HEAD WITHOUT CONTRAST CT MAXILLOFACIAL WITHOUT CONTRAST CT CERVICAL SPINE WITHOUT CONTRAST CT CHEST, ABDOMEN AND PELVIS WITH CONTRAST TECHNIQUE: Contiguous axial images were obtained from the base of the skull through the vertex without intravenous contrast. Multidetector CT imaging of the maxillofacial structures was performed. Multiplanar CT image reconstructions were also generated. A small metallic BB was placed on the right temple in order to reliably differentiate right from left. Multidetector CT imaging of the cervical spine was performed without intravenous contrast. Multiplanar CT image reconstructions were also generated. Multidetector CT imaging of the chest, abdomen and pelvis was performed following the standard protocol during bolus administration of intravenous contrast. CONTRAST:  80mL OMNIPAQUE IOHEXOL 300 MG/ML  SOLN COMPARISON:  CT head dated 08/16/2010 FINDINGS: CT HEAD: Brain: No evidence of acute infarction, hemorrhage, hydrocephalus, extra-axial collection or mass lesion/mass effect. There is advanced atrophy, greater than expected for the patient's stated age. Vascular: No hyperdense vessel or unexpected calcification. Skull: Normal. Negative for fracture or focal lesion. There is right frontal scalp swelling. Other: None. CT MAXILLOFACIAL FINDINGS Osseous: No fracture or mandibular dislocation. No destructive process. Orbits: Negative. No  traumatic or inflammatory finding. Sinuses: Clear. Soft tissues: There is right periorbital soft tissue swelling. CT CERVICAL SPINE FINDINGS Alignment: Normal. Skull base and vertebrae: No acute fracture. No primary bone lesion or focal pathologic process. 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 There are multilevel degenerative changes throughout the cervical spine, greatest at the C5-C6 level. There is osseous neural foraminal narrowing at the C4-C5 and C5-C6 levels bilaterally. Soft tissues and spinal canal: No prevertebral fluid or swelling. No  visible canal hematoma. Disc levels: Multilevel disc height loss is noted throughout the cervical spine. Other: None. CT CHEST FINDINGS Cardiovascular: The thoracic aorta is unremarkable. Coronary artery calcifications are noted. The heart size is normal. There is no significant pericardial effusion. Mediastinum/Nodes: --No mediastinal or hilar lymphadenopathy. --No axillary lymphadenopathy. --No supraclavicular lymphadenopathy. --Normal thyroid gland. --The esophagus is unremarkable Lungs/Pleura: There is some mild atelectasis at the lung bases and within the right middle lobe. There is no pneumothorax. No significant pleural effusion. Musculoskeletal: No chest wall abnormality. No acute or significant osseous findings. CT ABDOMEN AND PELVIS FINDINGS Hepatobiliary: There is decreased hepatic attenuation suggestive of hepatic steatosis. Normal gallbladder.There is no biliary ductal dilation. Pancreas: Normal contours without ductal dilatation. No peripancreatic fluid collection. Spleen: No splenic laceration or hematoma. Adrenals/Urinary Tract: --Adrenal glands: No adrenal hemorrhage. 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 There are no radiopaque kidney stones. --Urinary bladder: There is a focus of gas within the urinary bladder. Stomach/Bowel: --Stomach/Duodenum: No hiatal hernia or other gastric abnormality. Normal duodenal  course and caliber. --Small bowel: No dilatation or inflammation. --Colon: There is scattered colonic diverticula without CT evidence for diverticulitis. --Appendix: Not visualized. No right lower quadrant inflammation or free fluid. Vascular/Lymphatic: Normal course and caliber of the major abdominal vessels. --No retroperitoneal lymphadenopathy. --No mesenteric lymphadenopathy. --No pelvic or inguinal lymphadenopathy. Reproductive: Unremarkable Other: No ascites or free air. The abdominal wall is normal. Musculoskeletal. There appears to be some mild height loss of the T11 vertebral body, likely chronic. There is an age indeterminate fracture of the L3 transverse process on the right. This is favored to be chronic. IMPRESSION: 1. No acute intracranial process. 2. No acute cervical spine fracture or dislocation. 3. Right frontal scalp soft tissue swelling. 4. No acute maxillofacial fracture. 5. No acute intra-abdominal or pelvic injury. 6. Hepatic steatosis. 7. Horseshoe kidney. 8. Colonic diverticulosis without CT evidence for diverticulitis. 9. There is a chronic appearing L3 transverse process fracture on the right. 10. Focus of gas within the urinary bladder. Correlation with urinalysis and patient history is recommended. This may be secondary to prior instrumentation or an infection with a gas-forming organism. Electronically Signed: By: Katherine Mantle M.D. 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 Electronically Signed   By: Katherine Mantle M.D.   On: 03/17/2019 18:08   Result Date: 03/17/2019 CLINICAL DATA:  Acute pain due to trauma. Hematoma to the right eye. EXAM: CT HEAD WITHOUT CONTRAST CT MAXILLOFACIAL WITHOUT CONTRAST CT CERVICAL SPINE WITHOUT CONTRAST CT CHEST, ABDOMEN AND PELVIS WITH CONTRAST TECHNIQUE:  Contiguous axial images were obtained from the base of the skull through the vertex without intravenous contrast. Multidetector CT imaging of the maxillofacial structures was performed. Multiplanar CT image reconstructions were also generated. A small metallic BB was placed on the right temple in order to reliably differentiate right from left. Multidetector CT imaging of the cervical spine was performed without intravenous contrast. Multiplanar CT image reconstructions were also generated. Multidetector CT imaging of the chest, abdomen and pelvis was performed following the standard protocol during bolus administration of intravenous contrast. CONTRAST:  80mL OMNIPAQUE IOHEXOL 300 MG/ML  SOLN COMPARISON:  CT head dated 08/16/2010 FINDINGS: CT HEAD: Brain: No evidence of acute infarction, hemorrhage, hydrocephalus, extra-axial collection or mass lesion/mass effect. There is advanced atrophy, greater than expected for  the patient's stated age. Vascular: No hyperdense vessel or unexpected calcification. Skull: Normal. Negative for fracture or focal lesion. There is right frontal scalp swelling. Other: None. CT MAXILLOFACIAL FINDINGS Osseous: No fracture or mandibular dislocation. No destructive process. Orbits: Negative. No traumatic or inflammatory finding. Sinuses: Clear. Soft tissues: There is right periorbital soft tissue swelling. CT CERVICAL SPINE FINDINGS Alignment: Normal. Skull base and vertebrae: No acute fracture. No primary bone lesion or focal pathologic process. 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 There are multilevel degenerative changes throughout the cervical spine, greatest at the C5-C6 level. There is osseous neural foraminal narrowing at the C4-C5 and C5-C6 levels bilaterally. Soft tissues and spinal canal: No prevertebral fluid or swelling. No visible canal hematoma. Disc levels: Multilevel disc height loss is noted throughout the cervical spine. Other: None. CT CHEST  FINDINGS Cardiovascular: The thoracic aorta is unremarkable. Coronary artery calcifications are noted. The heart size is normal. There is no significant pericardial effusion. Mediastinum/Nodes: --No mediastinal or hilar lymphadenopathy. --No axillary lymphadenopathy. --No supraclavicular lymphadenopathy. --Normal thyroid gland. --The esophagus is unremarkable Lungs/Pleura: There is some mild atelectasis at the lung bases and within the right middle lobe. There is no pneumothorax. No significant pleural effusion. Musculoskeletal: No chest wall abnormality. No acute or significant osseous findings. CT ABDOMEN AND PELVIS FINDINGS Hepatobiliary: There is decreased hepatic attenuation suggestive of hepatic steatosis. Normal gallbladder.There is no biliary ductal dilation. Pancreas: Normal contours without ductal dilatation. No peripancreatic fluid collection. Spleen: No splenic laceration or hematoma. Adrenals/Urinary Tract: --Adrenal glands: No adrenal hemorrhage. 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 There are no radiopaque kidney stones. --Urinary bladder: There is a focus of gas within the urinary bladder. Stomach/Bowel: --Stomach/Duodenum: No hiatal hernia or other gastric abnormality. Normal duodenal course and caliber. --Small bowel: No dilatation or inflammation. --Colon: There is scattered colonic diverticula without CT evidence for diverticulitis. --Appendix: Not visualized. No right lower quadrant inflammation or free fluid. Vascular/Lymphatic: Normal course and caliber of the major abdominal vessels. --No retroperitoneal lymphadenopathy. --No mesenteric lymphadenopathy. --No pelvic or inguinal lymphadenopathy. Reproductive: Unremarkable Other: No ascites or free air. The abdominal wall is normal. Musculoskeletal. There appears to be some mild height loss of the T11 vertebral body, likely chronic. There is an age indeterminate fracture of the L3 transverse process on the right.  This is favored to be chronic. IMPRESSION: 1. No acute intracranial process. 2. No acute cervical spine fracture or dislocation. 3. Right frontal scalp soft tissue swelling. 4. No acute maxillofacial fracture. 5. No acute intra-abdominal or pelvic injury. 6. Hepatic steatosis. 7. Horseshoe kidney. 8. Colonic diverticulosis without CT evidence for diverticulitis. 9. There is a chronic appearing L3 transverse process fracture on the right. 10. Focus of gas within the urinary bladder. Correlation with urinalysis and patient history is recommended. This may be secondary to prior instrumentation or an infection with a gas-forming organism. Electronically Signed: By: Katherine Mantle M.D. On: 03/17/2019 18:04   Ct L-spine No Charge  Result Date: 03/17/2019 CLINICAL DATA:  Back pain, minor trauma. Patient was found on the floor with sore on chest in left elbow and hematoma over the right eye. EXAM: CT LUMBAR SPINE WITHOUT CONTRAST TECHNIQUE: Multidetector CT imaging of the lumbar spine was performed without intravenous contrast administration. Multiplanar CT image reconstructions were also generated. COMPARISON:  None. FINDINGS: Segmentation: 5 non rib-bearing lumbar type vertebral bodies are present. The lowest fully formed vertebral body is L5. Alignment: There is some straightening of the  normal lumbar lordosis. No significant listhesis is present. Vertebrae: Vertebral body heights are maintained. Well corticated fragment at the right transverse process of L3 is most consistent with remote trauma. There is no evidence for acute fracture. Paraspinal and other soft tissues: A horseshoe kidney is noted. Visualized paraspinous soft tissues are otherwise unremarkable. Disc levels: Broad-based disc protrusions are present at L3-4, L4-5, and L5-S1. Disc material extends into the foramina at each of these levels. This contributes to mild foraminal narrowing bilaterally. No other significant disc disease or stenosis is  evident. IMPRESSION: 1. No acute abnormality. 2. Remote fracture involving the right transverse process of L3. 3. 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 Electronically Signed   By: Marin Roberts M.D.   On: 03/17/2019 17:50   Dg Knee Complete 4 Views Left  Result Date: 03/17/2019 CLINICAL DATA:  Fall EXAM: LEFT KNEE - COMPLETE 4+ VIEW COMPARISON:  None. FINDINGS: No fracture or malalignment. Minimal medial and patellofemoral degenerative change. No large knee effusion IMPRESSION: No acute osseous abnormality Electronically Signed   By: Jasmine Pang M.D.   On: 03/17/2019 16:32   Dg Knee Complete 4 Views Right  Result Date: 03/17/2019 CLINICAL DATA:  Larey Seat in bathroom EXAM: RIGHT KNEE - COMPLETE 4+ VIEW COMPARISON:  None. FINDINGS: No fracture or malalignment. No large knee effusion. Joint spaces are relatively maintained IMPRESSION: No acute osseous abnormality Electronically Signed   By: Jasmine Pang M.D.   On: 03/17/2019 16:31   Ct Maxillofacial Wo Contrast  Addendum Date: 03/17/2019   ADDENDUM REPORT: 03/17/2019 18:08 ADDENDUM: There is intracranial atrophy that is significantly greater than expected for the patient's recorded age. Electronically Signed   By: Katherine Mantle M.D.   On: 03/17/2019 18:08   Result Date: 03/17/2019 CLINICAL DATA:  Acute pain due to trauma. Hematoma to the right eye. EXAM: CT HEAD WITHOUT CONTRAST CT MAXILLOFACIAL WITHOUT CONTRAST CT CERVICAL SPINE WITHOUT CONTRAST CT CHEST, ABDOMEN AND PELVIS WITH CONTRAST TECHNIQUE: Contiguous axial images were obtained from the base of the skull through the vertex without intravenous contrast. Multidetector CT imaging of the maxillofacial structures was performed. Multiplanar CT image reconstructions were also generated. A small metallic BB was placed on the right temple in order to reliably differentiate right from left. Multidetector CT imaging of the cervical spine was performed without intravenous contrast. Multiplanar  CT image reconstructions were also generated. Multidetector CT imaging of the chest, abdomen and pelvis was performed following the standard protocol during bolus administration of intravenous contrast. CONTRAST:  67mL OMNIPAQUE IOHEXOL 300 MG/ML  SOLN COMPARISON:  CT head dated 08/16/2010 FINDINGS: CT HEAD: Brain: No evidence of acute infarction, hemorrhage, hydrocephalus, extra-axial collection or mass lesion/mass effect. There is advanced atrophy, greater than expected for the patient's stated age. Vascular: No hyperdense vessel or unexpected calcification. Skull: Normal. Negative for fracture or focal lesion. There is right frontal scalp swelling. Other: None. CT MAXILLOFACIAL FINDINGS Osseous: No fracture or mandibular dislocation. No destructive process. Orbits: Negative. No traumatic or inflammatory finding. Sinuses: Clear. Soft tissues: There is right periorbital soft tissue swelling. CT CERVICAL SPINE FINDINGS Alignment: Normal. Skull base and vertebrae: No acute fracture. No primary bone lesion or focal pathologic process. 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 There are multilevel degenerative changes throughout the cervical spine, greatest at the C5-C6 level. There is osseous neural foraminal narrowing at the C4-C5 and C5-C6 levels bilaterally. Soft tissues and spinal canal: No prevertebral fluid or swelling. No visible canal hematoma.  Disc levels: Multilevel disc height loss is noted throughout the cervical spine. Other: None. CT CHEST FINDINGS Cardiovascular: The thoracic aorta is unremarkable. Coronary artery calcifications are noted. The heart size is normal. There is no significant pericardial effusion. Mediastinum/Nodes: --No mediastinal or hilar lymphadenopathy. --No axillary lymphadenopathy. --No supraclavicular lymphadenopathy. --Normal thyroid gland. --The esophagus is unremarkable Lungs/Pleura: There is some mild atelectasis at the lung bases and within the right middle  lobe. There is no pneumothorax. No significant pleural effusion. Musculoskeletal: No chest wall abnormality. No acute or significant osseous findings. CT ABDOMEN AND PELVIS FINDINGS Hepatobiliary: There is decreased hepatic attenuation suggestive of hepatic steatosis. Normal gallbladder.There is no biliary ductal dilation. Pancreas: Normal contours without ductal dilatation. No peripancreatic fluid collection. Spleen: No splenic laceration or hematoma. Adrenals/Urinary Tract: --Adrenal glands: No adrenal hemorrhage. 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 There are no radiopaque kidney stones. --Urinary bladder: There is a focus of gas within the urinary bladder. Stomach/Bowel: --Stomach/Duodenum: No hiatal hernia or other gastric abnormality. Normal duodenal course and caliber. --Small bowel: No dilatation or inflammation. --Colon: There is scattered colonic diverticula without CT evidence for diverticulitis. --Appendix: Not visualized. No right lower quadrant inflammation or free fluid. Vascular/Lymphatic: Normal course and caliber of the major abdominal vessels. --No retroperitoneal lymphadenopathy. --No mesenteric lymphadenopathy. --No pelvic or inguinal lymphadenopathy. Reproductive: Unremarkable Other: No ascites or free air. The abdominal wall is normal. Musculoskeletal. There appears to be some mild height loss of the T11 vertebral body, likely chronic. There is an age indeterminate fracture of the L3 transverse process on the right. This is favored to be chronic. IMPRESSION: 1. No acute intracranial process. 2. No acute cervical spine fracture or dislocation. 3. Right frontal scalp soft tissue swelling. 4. No acute maxillofacial fracture. 5. No acute intra-abdominal or pelvic injury. 6. Hepatic steatosis. 7. Horseshoe kidney. 8. Colonic diverticulosis without CT evidence for diverticulitis. 9. There is a chronic appearing L3 transverse process fracture on the right. 10. Focus of  gas within the urinary bladder. Correlation with urinalysis and patient history is recommended. This may be secondary to prior instrumentation or an infection with a gas-forming organism. Electronically Signed: By: Katherine Mantlehristopher  Green M.D. On: 03/17/2019 18:04   Assessment:  48 year old male admitted for rhabdomyolysis after attempted overdose with unusual presentation progressive left arm weakness and numbness.  Associated with swelling of the forearm.  Differentials include stroke, compressive myelopathy, deep vein thrombosis of the upper limb versus compartment syndrome. Stroke would be higher on the differential however patient states that this is gradually progressive. Myelopathy versus syrinx with lesion at the C5-C6 level makes more sense, patient also mildly brisk reflexes in lower extremities.  Patient also has left lower extremity weakness.  Given the swelling.  Compartment syndrome is a possibility however patient has weakness above the forearm swelling as well (biceps, triceps and deltoid).  Also does not complain of much pain.  DVT would explain swelling but which would not explain mucus although patient may be exaggerating his symptoms.  Loss of reflexes in the left UE may be explained due to extremity edema making it difficult to elicit reflexes.   Impression Left upper extremity weakness and swelling  Recommendations Left upper extremity venous Doppler Obtain MRI of the brain and C-spine  Valentina LucksJessica Williams, MSN, NP-C [facility] [phone]  Attending neurologist's note to follow  03/19/2019, 11:27 AM  I have seen the patient reviewed the above note.  He does have some hyperreflexia of the lower extremities, with crossed  adductors when checking on the right.  There does seem to be worse stenosis on the left than the right, which could explain why he has an asymmetric exam.  He has significant cervical stenosis and therefore I think that neurosurgery should give an  opinion as to whether this could be the causative problem.  1) neurosurgical consultation 2) we will follow  Ritta Slot, MD [facility] [phone]  If 7pm- 7am, please page neurology on call as listed in AMION.

## 2019-03-19 NOTE — Progress Notes (Signed)
Subjective: The patient was resting comfortably in his bed today. He stated that he continues to have numbness and weakness of the left arm. He is now able to see clearly out of his right eye and is otherwise not in acute pain. All questions were answered.   Objective:  Vital signs in last 24 hours: Vitals:   03/18/19 2005 03/18/19 2306 03/19/19 0315 03/19/19 0531  BP: 125/78 126/80 (!) 145/95 (!) 135/94  Pulse: 88 82 75 81  Resp: 16 15 15 17   Temp: 98.3 F (36.8 C) 98.6 F (37 C) 98.4 F (36.9 C)   TempSrc: Oral Oral Oral   SpO2: 98% 99% 99% 99%  Weight:      Height:       General: A/O x4, in no acute distress, afebrile, nondiaphoretic HEENT: PEERL, EMO intact, R subconjunctival hematoma improved, no purulent drainage Cardio: RRR, no mrg's  Pulmonary: CTA bilaterally, no wheezing or crackles  Abdomen: Bowel sounds normal, soft, nontender  MSK: BLE nontender, nonedematous Neuro: Alert, CNII-XII grossly intact, conversational, strength 2/5 in the left upper and 5/5 in the right upper. Remains 3/5 lower bilaterally Psych: Appropriate affect, not depressed in appearance, engages well  Assessment/Plan:  Principal Problem:   Major depressive disorder, recurrent episode, severe ([place]) Active Problems:   Rhabdomyolysis   Suicide attempt ([place])   Thrombocytopenia ([place])   AKI (acute kidney injury) ([place])   Hematoma of eyelid   Alcohol use disorder, severe, dependence ([place])  Patient is a 48 year old male with a history of HTN, polysubstance use disorder, depression and hepatic steatosis who presented to the ER via EMS after being found down at home for unknown known amount of time.  He is suspected to have been down for 4 days after taking Lorazepam, trazodone and alcohol and a attempted suicide.  1 admission he was noted to have a mild case of rhabdomyolysis with CK greater than 8000, right I suspect Jowell hemorrhage and surrounding edema of the soft tissue, thrombocytopenia 65, and  minimally responsive.  A/P: Suicide attempt: Polysubstance use disorder: Psychiatry has been consulted and are recommending inpatient psych once medically cleared.  They believe the patient to remain at risk for suicide given his admission to such.  They further advised not utilizing antidepressants or antipsychotics due to CKs below 3000 with the exception of the CIWA. -Continue CIWA with lorazepam - Consider antipsychotics vs antidepressants when medically stable   Rhabdomyolysis: Improving, mild initially 8252.  Initially decreased to ~5000 but now back up to 8000.  Fluids decreased due to edema and improvement but given the increase in CK we will resume high flow.  We will continue to trend his CK, BMP and adjust fluids as indicated.  Serum creatinine is returned to normal.  - CK levels every 12 hours - BMPs Q8 hours for electrolytes monitoring - Fluid 250 mL/hour NS - Encourage p.o. intake  Subconjunctival hematoma with subconjunctival hemorrhage of the right eye: Seen by Dr. Kathlen Mody with ophthalmology.  Recommending erythromycin ointment x5 days.  They did not feel that this represented a ruptured globe or other more ominous injury.  Left upper extremity weakness and swelling: I feel this may be due to soft tissue edema with subsequent neuronal damage from prolonged compression as a result of being immobile when unconscious. Seen by neurology overnight due to weakness and tingling of the left arm.  Dr. Lorraine Lax of neurology feels that this is likely gradually progressive myelopathy versus less likely syrinx at the C5-C6 level but  the differential remains broad. - I agree with an MRI of the head and neck that has been ordered for evaluation - Continue neck brace until MRI cleared by neuro  Code: Full Diet: Regular DVT prophylaxis: Enoxaparin Fluids: N/A Dispo: Anticipated discharge in approximately 2-3 day(s).   Lanelle BalHarbrecht, Bernell Sigal, MD 03/19/2019, 8:46 AM Pager: # [phone]

## 2019-03-19 NOTE — Consult Note (Signed)
Requesting Physician: Dr. Erlinda Hong    Chief Complaint: Left upper extremity weakness  History obtained from: Patient and Chart    HPI:                                                                                                                                       Clayton Baldwin is a 48 y.o. male with past medical history of hypertension, depression, polysubstance and alcohol abuse admitted to [facility] after suicide attempt using trazodone and lorazepam.  Family had not heard from him and called police to check on him and patient was found in the floor in prone position.  He admits to overdosing on lorazepam and trazodone as well as drinking 1-2 beers on Tuesday.  Patient blacked out and then remembers being awake but on the floor with generalized weakness and unable to get up.  He noted to have muscle pain in both eyes and arms and work-up revealed that he has rhabdomyolysis with elevated CK in the 8000 and acute kidney injury.  Today morning, around 5:30 AM his nurse paged the patient for new onset left arm weakness and numbness.  Patient states that he was fine yesterday afternoon around 2 PM after physical therapy he noticed his left grip was slowly getting worse and noticed some numbness and tingling over his forearm.  He states that he previously felt weak in the right arm but after removing the IV to the left upper extremity is able to move the right arm again   Past Medical History:  Diagnosis Date  . Depression   . Hypertension   . Insomnia     Past Surgical History:  Procedure Laterality Date  . APPENDECTOMY      Family History  Problem Relation Age of Onset  . Liver disease Other   . Colon cancer Neg Hx   . Colon polyps Neg Hx    Social History:  reports that he has never smoked. He does not have any smokeless tobacco history on file. He reports current alcohol use. He reports that he does not use drugs.  Allergies:  Allergies  Allergen Reactions   . Grass Extracts [Gramineae Pollens] Itching and Other (See Comments)    Wheezing, also    Medications:  I reviewed home medications   ROS:                                                                                                                                     14 systems reviewed and negative except above    Examination:                                                                                                      General: Appears well-developed and well-nourished.  Psych: Affect appropriate to situation Eyes: Preseptal edema in the right eye HENT: No OP obstrucion Head: Normocephalic.  Cardiovascular: Normal rate and regular rhythm.  Respiratory: Effort normal and breath sounds normal to anterior ascultation GI: Soft.  No distension. There is no tenderness.  Skin: WDI Extremity: He has swollen left forearm and arm    Neurological Examination Mental Status: Alert, oriented, thought content appropriate.  Speech fluent without evidence of aphasia. Able to follow 3 step commands without difficulty. Cranial Nerves: II: Visual fields grossly normal,  III,IV, VI: ptosis not present, extra-ocular motions intact bilaterally, pupils equal, round, reactive to light and accommodation V,VII: smile symmetric, facial light touch sensation normal bilaterally VIII: hearing normal bilaterally IX,X: uvula rises symmetrically XI: bilateral shoulder shrug XII: midline tongue extension Motor: Left upper extremity: Patient has 4/5 strength over shoulder abduction: 3/ 5 shoulder adduction, 0/5 to elbow flexion and extension as well as 0/5 wrist flexion and extension. Left lower extremity   4/5 Right : Upper extremity   4+/5      Lower extremity   4+/5                         Tone and bulk:normal tone throughout; no atrophy noted Sensory: Reduced sensation to  light touch at the level of the forearm, intact sensation in the plantar and dorsal palmar surfaces.  Intact sensation over right arm and both legs Deep Tendon Reflexes: He has 2+ reflexes in the right biceps and brachioradialis, slightly brisk patellar reflexes and 2+ ankle reflexes, no clonus appreciated.  Unable to elicit reflexes in the left brachioradialis and left biceps Plantars: Right: downgoing   Left: downgoing Cerebellar: normal finger-to-nose on the right side     Lab Results: Basic Metabolic Panel: Recent Labs  Lab 03/17/19 2131 03/18/19 0004 03/18/19 0421 03/18/19 0802 03/18/19 1202 03/18/19 1619  NA 145 144 145 146* 144 141  K 3.1* 3.5 3.1* 3.6 3.6 3.8  CL 114* 109  109 114* 113* 113*  CO2 21* 22 22 22 24 23   GLUCOSE 115* 141* 100* 101* 108* 132*  BUN 38* 38* 35* 33* 31* 29*  CREATININE 1.59* 1.62* 1.39* 1.33* 1.23 1.17  CALCIUM 6.5* 7.6* 7.7* 7.7* 7.4* 7.2*  MG 1.9  --   --   --   --   --   PHOS 3.3  --   --   --   --   --     CBC: Recent Labs  Lab 03/17/19 1527 03/17/19 1609 03/18/19 0421  WBC 7.0  --  6.0  HGB 17.7* 17.7* 15.0  HCT 51.7 52.0 43.5  MCV 113.1*  --  112.4*  PLT 65*  --  55*    Coagulation Studies: Recent Labs    03/17/19 1527 03/17/19 2131  LABPROT 13.4 15.7*  INR 1.0 1.3*    Imaging: Dg Chest 1 View  Result Date: 03/17/2019 CLINICAL DATA:  Fall EXAM: CHEST  1 VIEW COMPARISON:  08/16/2010 FINDINGS: The heart size and mediastinal contours are within normal limits. Both lungs are clear. The visualized skeletal structures are unremarkable. IMPRESSION: No active disease. Electronically Signed   By: Jasmine Pang M.D.   On: 03/17/2019 16:33   Dg Pelvis 1-2 Views  Result Date: 03/17/2019 CLINICAL DATA:  Fall EXAM: PELVIS - 1-2 VIEW COMPARISON:  None. FINDINGS: There is no evidence of pelvic fracture or diastasis. No pelvic bone lesions are seen. IMPRESSION: Negative. Electronically Signed   By: Jasmine Pang M.D.   On: 03/17/2019  16:33   Dg Elbow Complete Left  Result Date: 03/17/2019 CLINICAL DATA:  Fall with elbow abrasion EXAM: LEFT ELBOW - COMPLETE 3+ VIEW COMPARISON:  None. FINDINGS: No fracture or malalignment. No significant elbow effusion. Protuberant soft tissue swelling dorsal aspect of the proximal forearm. IMPRESSION: No acute osseous abnormality Electronically Signed   By: Jasmine Pang M.D.   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 Electronically Signed   By: Katherine Mantle M.D.   On: 03/17/2019 18:08   Result Date: 03/17/2019 CLINICAL DATA:  Acute pain due to trauma. Hematoma to the right eye. EXAM: CT HEAD WITHOUT CONTRAST CT MAXILLOFACIAL WITHOUT CONTRAST CT CERVICAL SPINE WITHOUT CONTRAST CT CHEST, ABDOMEN AND PELVIS WITH CONTRAST TECHNIQUE: Contiguous axial images were obtained from the base of the skull through the vertex without intravenous contrast. Multidetector CT imaging of the maxillofacial structures was performed. Multiplanar CT image reconstructions were also generated. A small metallic BB was placed on the right temple in order to reliably differentiate right from left. Multidetector CT imaging of the cervical spine was performed without intravenous contrast. Multiplanar CT image reconstructions were also generated. Multidetector CT imaging of the chest, abdomen and pelvis was performed following the standard protocol during bolus administration of intravenous contrast. CONTRAST:  80mL OMNIPAQUE IOHEXOL 300 MG/ML  SOLN COMPARISON:  CT head dated 08/16/2010 FINDINGS: CT HEAD: Brain: No evidence of acute infarction, hemorrhage, hydrocephalus, extra-axial collection or mass lesion/mass effect. There is advanced atrophy, greater than expected for the patient's stated age. Vascular: No hyperdense vessel or unexpected  calcification. Skull: Normal. Negative for fracture or focal lesion. There is right frontal scalp swelling. Other: None. CT MAXILLOFACIAL FINDINGS Osseous: No fracture or mandibular dislocation. No destructive process. Orbits: Negative. No traumatic or inflammatory finding. Sinuses: Clear. Soft tissues: There is  right periorbital soft tissue swelling. CT CERVICAL SPINE FINDINGS Alignment: Normal. Skull base and vertebrae: No acute fracture. No primary bone lesion or focal pathologic process. 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 There are multilevel degenerative changes throughout the cervical spine, greatest at the C5-C6 level. There is osseous neural foraminal narrowing at the C4-C5 and C5-C6 levels bilaterally. Soft tissues and spinal canal: No prevertebral fluid or swelling. No visible canal hematoma. Disc levels: Multilevel disc height loss is noted throughout the cervical spine. Other: None. CT CHEST FINDINGS Cardiovascular: The thoracic aorta is unremarkable. Coronary artery calcifications are noted. The heart size is normal. There is no significant pericardial effusion. Mediastinum/Nodes: --No mediastinal or hilar lymphadenopathy. --No axillary lymphadenopathy. --No supraclavicular lymphadenopathy. --Normal thyroid gland. --The esophagus is unremarkable Lungs/Pleura: There is some mild atelectasis at the lung bases and within the right middle lobe. There is no pneumothorax. No significant pleural effusion. Musculoskeletal: No chest wall abnormality. No acute or significant osseous findings. CT ABDOMEN AND PELVIS FINDINGS Hepatobiliary: There is decreased hepatic attenuation suggestive of hepatic steatosis. Normal gallbladder.There is no biliary ductal dilation. Pancreas: Normal contours without ductal dilatation. No peripancreatic fluid collection. Spleen: No splenic laceration or hematoma. Adrenals/Urinary Tract: --Adrenal glands: No adrenal hemorrhage. --there is a horseshoe kidney  without evidence for hydronephrosis. There are no radiopaque kidney stones. --Urinary bladder: There is a focus of gas within the urinary bladder. Stomach/Bowel: --Stomach/Duodenum: No hiatal hernia or other gastric abnormality. Normal duodenal course and caliber. --Small bowel: No dilatation or inflammation. --Colon: There is scattered colonic diverticula without CT evidence for diverticulitis. --Appendix: Not visualized. No right lower quadrant inflammation or free fluid. Vascular/Lymphatic: Normal course and caliber of the major abdominal vessels. --No retroperitoneal lymphadenopathy. --No mesenteric lymphadenopathy. --No pelvic or inguinal lymphadenopathy. Reproductive: Unremarkable Other: No ascites or free air. The abdominal wall is normal. Musculoskeletal. There appears to be some mild height loss of the T11 vertebral body, likely chronic. There is an age indeterminate fracture of the L3 transverse process on the right. This is favored to be chronic. IMPRESSION: 1. No acute intracranial process. 2. No acute cervical spine fracture or dislocation. 3. Right frontal scalp soft tissue swelling. 4. No acute maxillofacial fracture. 5. No acute intra-abdominal or pelvic injury. 6. Hepatic steatosis. 7. Horseshoe kidney. 8. Colonic diverticulosis without CT evidence for diverticulitis. 9. There is a chronic appearing L3 transverse process fracture on the right. 10. Focus of gas within the urinary bladder. Correlation with urinalysis and patient history is recommended. This may be secondary to prior instrumentation or an infection with a gas-forming organism. Electronically Signed: By: Katherine Mantle M.D. 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 Electronically Signed   By: Katherine Mantle M.D.   On: 03/17/2019 18:08   Result  Date: 03/17/2019 CLINICAL DATA:  Acute pain due to trauma. Hematoma to the right eye. EXAM: CT HEAD WITHOUT CONTRAST CT MAXILLOFACIAL WITHOUT CONTRAST CT CERVICAL SPINE WITHOUT CONTRAST CT CHEST, ABDOMEN AND PELVIS WITH CONTRAST TECHNIQUE: Contiguous axial images were obtained from the base of the skull through the vertex without intravenous contrast. Multidetector CT imaging of the maxillofacial structures was performed. Multiplanar CT image reconstructions were also generated. A small metallic BB was placed on the right temple in order to reliably differentiate right from left. Multidetector CT imaging of the cervical spine  was performed without intravenous contrast. Multiplanar CT image reconstructions were also generated. Multidetector CT imaging of the chest, abdomen and pelvis was performed following the standard protocol during bolus administration of intravenous contrast. CONTRAST:  80mL OMNIPAQUE IOHEXOL 300 MG/ML  SOLN COMPARISON:  CT head dated 08/16/2010 FINDINGS: CT HEAD: Brain: No evidence of acute infarction, hemorrhage, hydrocephalus, extra-axial collection or mass lesion/mass effect. There is advanced atrophy, greater than expected for the patient's stated age. Vascular: No hyperdense vessel or unexpected calcification. Skull: Normal. Negative for fracture or focal lesion. There is right frontal scalp swelling. Other: None. CT MAXILLOFACIAL FINDINGS Osseous: No fracture or mandibular dislocation. No destructive process. Orbits: Negative. No traumatic or inflammatory finding. Sinuses: Clear. Soft tissues: There is right periorbital soft tissue swelling. CT CERVICAL SPINE FINDINGS Alignment: Normal. Skull base and vertebrae: No acute fracture. No primary bone lesion or focal pathologic process. 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 There are multilevel degenerative changes throughout the cervical spine, greatest at the C5-C6 level. There is osseous neural foraminal narrowing at the  C4-C5 and C5-C6 levels bilaterally. Soft tissues and spinal canal: No prevertebral fluid or swelling. No visible canal hematoma. Disc levels: Multilevel disc height loss is noted throughout the cervical spine. Other: None. CT CHEST FINDINGS Cardiovascular: The thoracic aorta is unremarkable. Coronary artery calcifications are noted. The heart size is normal. There is no significant pericardial effusion. Mediastinum/Nodes: --No mediastinal or hilar lymphadenopathy. --No axillary lymphadenopathy. --No supraclavicular lymphadenopathy. --Normal thyroid gland. --The esophagus is unremarkable Lungs/Pleura: There is some mild atelectasis at the lung bases and within the right middle lobe. There is no pneumothorax. No significant pleural effusion. Musculoskeletal: No chest wall abnormality. No acute or significant osseous findings. CT ABDOMEN AND PELVIS FINDINGS Hepatobiliary: There is decreased hepatic attenuation suggestive of hepatic steatosis. Normal gallbladder.There is no biliary ductal dilation. Pancreas: Normal contours without ductal dilatation. No peripancreatic fluid collection. Spleen: No splenic laceration or hematoma. Adrenals/Urinary Tract: --Adrenal glands: No adrenal hemorrhage. 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 There are no radiopaque kidney stones. --Urinary bladder: There is a focus of gas within the urinary bladder. Stomach/Bowel: --Stomach/Duodenum: No hiatal hernia or other gastric abnormality. Normal duodenal course and caliber. --Small bowel: No dilatation or inflammation. --Colon: There is scattered colonic diverticula without CT evidence for diverticulitis. --Appendix: Not visualized. No right lower quadrant inflammation or free fluid. Vascular/Lymphatic: Normal course and caliber of the major abdominal vessels. --No retroperitoneal lymphadenopathy. --No mesenteric lymphadenopathy. --No pelvic or inguinal lymphadenopathy. Reproductive: Unremarkable Other: No  ascites or free air. The abdominal wall is normal. Musculoskeletal. There appears to be some mild height loss of the T11 vertebral body, likely chronic. There is an age indeterminate fracture of the L3 transverse process on the right. This is favored to be chronic. IMPRESSION: 1. No acute intracranial process. 2. No acute cervical spine fracture or dislocation. 3. Right frontal scalp soft tissue swelling. 4. No acute maxillofacial fracture. 5. No acute intra-abdominal or pelvic injury. 6. Hepatic steatosis. 7. Horseshoe kidney. 8. Colonic diverticulosis without CT evidence for diverticulitis. 9. There is a chronic appearing L3 transverse process fracture on the right. 10. Focus of gas within the urinary bladder. Correlation with urinalysis and patient history is recommended. This may be secondary to prior instrumentation or an infection with a gas-forming organism. Electronically Signed: By: Katherine Mantlehristopher  Green M.D. On: 03/17/2019 18:04   Ct Cervical Spine Wo Contrast  Addendum Date: 03/17/2019   ADDENDUM REPORT: 03/17/2019 18:08 ADDENDUM: There  is intracranial atrophy that is significantly greater than expected for the patient's recorded age. Electronically Signed   By: Constance Holster M.D.   On: 03/17/2019 18:08   Result Date: 03/17/2019 CLINICAL DATA:  Acute pain due to trauma. Hematoma to the right eye. EXAM: CT HEAD WITHOUT CONTRAST CT MAXILLOFACIAL WITHOUT CONTRAST CT CERVICAL SPINE WITHOUT CONTRAST CT CHEST, ABDOMEN AND PELVIS WITH CONTRAST TECHNIQUE: Contiguous axial images were obtained from the base of the skull through the vertex without intravenous contrast. Multidetector CT imaging of the maxillofacial structures was performed. Multiplanar CT image reconstructions were also generated. A small metallic BB was placed on the right temple in order to reliably differentiate right from left. Multidetector CT imaging of the cervical spine was performed without intravenous contrast. Multiplanar CT image  reconstructions were also generated. Multidetector CT imaging of the chest, abdomen and pelvis was performed following the standard protocol during bolus administration of intravenous contrast. CONTRAST:  51mL OMNIPAQUE IOHEXOL 300 MG/ML  SOLN COMPARISON:  CT head dated 08/16/2010 FINDINGS: CT HEAD: Brain: No evidence of acute infarction, hemorrhage, hydrocephalus, extra-axial collection or mass lesion/mass effect. There is advanced atrophy, greater than expected for the patient's stated age. Vascular: No hyperdense vessel or unexpected calcification. Skull: Normal. Negative for fracture or focal lesion. There is right frontal scalp swelling. Other: None. CT MAXILLOFACIAL FINDINGS Osseous: No fracture or mandibular dislocation. No destructive process. Orbits: Negative. No traumatic or inflammatory finding. Sinuses: Clear. Soft tissues: There is right periorbital soft tissue swelling. CT CERVICAL SPINE FINDINGS Alignment: Normal. Skull base and vertebrae: No acute fracture. No primary bone lesion or focal pathologic process. 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 There are multilevel degenerative changes throughout the cervical spine, greatest at the C5-C6 level. There is osseous neural foraminal narrowing at the C4-C5 and C5-C6 levels bilaterally. Soft tissues and spinal canal: No prevertebral fluid or swelling. No visible canal hematoma. Disc levels: Multilevel disc height loss is noted throughout the cervical spine. Other: None. CT CHEST FINDINGS Cardiovascular: The thoracic aorta is unremarkable. Coronary artery calcifications are noted. The heart size is normal. There is no significant pericardial effusion. Mediastinum/Nodes: --No mediastinal or hilar lymphadenopathy. --No axillary lymphadenopathy. --No supraclavicular lymphadenopathy. --Normal thyroid gland. --The esophagus is unremarkable Lungs/Pleura: There is some mild atelectasis at the lung bases and within the right middle lobe. There  is no pneumothorax. No significant pleural effusion. Musculoskeletal: No chest wall abnormality. No acute or significant osseous findings. CT ABDOMEN AND PELVIS FINDINGS Hepatobiliary: There is decreased hepatic attenuation suggestive of hepatic steatosis. Normal gallbladder.There is no biliary ductal dilation. Pancreas: Normal contours without ductal dilatation. No peripancreatic fluid collection. Spleen: No splenic laceration or hematoma. Adrenals/Urinary Tract: --Adrenal glands: No adrenal hemorrhage. 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 There are no radiopaque kidney stones. --Urinary bladder: There is a focus of gas within the urinary bladder. Stomach/Bowel: --Stomach/Duodenum: No hiatal hernia or other gastric abnormality. Normal duodenal course and caliber. --Small bowel: No dilatation or inflammation. --Colon: There is scattered colonic diverticula without CT evidence for diverticulitis. --Appendix: Not visualized. No right lower quadrant inflammation or free fluid. Vascular/Lymphatic: Normal course and caliber of the major abdominal vessels. --No retroperitoneal lymphadenopathy. --No mesenteric lymphadenopathy. --No pelvic or inguinal lymphadenopathy. Reproductive: Unremarkable Other: No ascites or free air. The abdominal wall is normal. Musculoskeletal. There appears to be some mild height loss of the T11 vertebral body, likely chronic. There is an age indeterminate fracture of the L3 transverse process on the  right. This is favored to be chronic. IMPRESSION: 1. No acute intracranial process. 2. No acute cervical spine fracture or dislocation. 3. Right frontal scalp soft tissue swelling. 4. No acute maxillofacial fracture. 5. No acute intra-abdominal or pelvic injury. 6. Hepatic steatosis. 7. Horseshoe kidney. 8. Colonic diverticulosis without CT evidence for diverticulitis. 9. There is a chronic appearing L3 transverse process fracture on the right. 10. Focus of gas within  the urinary bladder. Correlation with urinalysis and patient history is recommended. This may be secondary to prior instrumentation or an infection with a gas-forming organism. Electronically Signed: By: Katherine Mantlehristopher  Green M.D. 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 Electronically Signed   By: Katherine Mantlehristopher  Green M.D.   On: 03/17/2019 18:08   Result Date: 03/17/2019 CLINICAL DATA:  Acute pain due to trauma. Hematoma to the right eye. EXAM: CT HEAD WITHOUT CONTRAST CT MAXILLOFACIAL WITHOUT CONTRAST CT CERVICAL SPINE WITHOUT CONTRAST CT CHEST, ABDOMEN AND PELVIS WITH CONTRAST TECHNIQUE: Contiguous axial images were obtained from the base of the skull through the vertex without intravenous contrast. Multidetector CT imaging of the maxillofacial structures was performed. Multiplanar CT image reconstructions were also generated. A small metallic BB was placed on the right temple in order to reliably differentiate right from left. Multidetector CT imaging of the cervical spine was performed without intravenous contrast. Multiplanar CT image reconstructions were also generated. Multidetector CT imaging of the chest, abdomen and pelvis was performed following the standard protocol during bolus administration of intravenous contrast. CONTRAST:  80mL OMNIPAQUE IOHEXOL 300 MG/ML  SOLN COMPARISON:  CT head dated 08/16/2010 FINDINGS: CT HEAD: Brain: No evidence of acute infarction, hemorrhage, hydrocephalus, extra-axial collection or mass lesion/mass effect. There is advanced atrophy, greater than expected for the patient's stated age. Vascular: No hyperdense vessel or unexpected calcification. Skull: Normal. Negative for fracture or focal lesion. There is right frontal scalp swelling. Other: None. CT MAXILLOFACIAL FINDINGS Osseous: No  fracture or mandibular dislocation. No destructive process. Orbits: Negative. No traumatic or inflammatory finding. Sinuses: Clear. Soft tissues: There is right periorbital soft tissue swelling. CT CERVICAL SPINE FINDINGS Alignment: Normal. Skull base and vertebrae: No acute fracture. No primary bone lesion or focal pathologic process. 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 There are multilevel degenerative changes throughout the cervical spine, greatest at the C5-C6 level. There is osseous neural foraminal narrowing at the C4-C5 and C5-C6 levels bilaterally. Soft tissues and spinal canal: No prevertebral fluid or swelling. No visible canal hematoma. Disc levels: Multilevel disc height loss is noted throughout the cervical spine. Other: None. CT CHEST FINDINGS Cardiovascular: The thoracic aorta is unremarkable. Coronary artery calcifications are noted. The heart size is normal. There is no significant pericardial effusion. Mediastinum/Nodes: --No mediastinal or hilar lymphadenopathy. --No axillary lymphadenopathy. --No supraclavicular lymphadenopathy. --Normal thyroid gland. --The esophagus is unremarkable Lungs/Pleura: There is some mild atelectasis at the lung bases and within the right middle lobe. There is no pneumothorax. No significant pleural effusion. Musculoskeletal: No chest wall abnormality. No acute or significant osseous findings. CT ABDOMEN AND PELVIS FINDINGS Hepatobiliary: There is decreased hepatic attenuation suggestive of hepatic steatosis. Normal gallbladder.There is no biliary ductal dilation. Pancreas: Normal contours without ductal dilatation. No peripancreatic fluid collection. Spleen: No splenic laceration or hematoma. Adrenals/Urinary Tract: --Adrenal glands: No adrenal hemorrhage. 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 There are no radiopaque kidney stones. --Urinary bladder: There is a focus of gas within the urinary bladder. Stomach/Bowel:  --Stomach/Duodenum: No hiatal hernia or other gastric abnormality. Normal duodenal course and caliber. --Small bowel: No dilatation or inflammation. --Colon: There is scattered colonic diverticula without CT evidence for diverticulitis. --Appendix: Not visualized. No right lower quadrant inflammation or free fluid. Vascular/Lymphatic: Normal course and caliber of the major abdominal vessels. --No retroperitoneal lymphadenopathy. --No mesenteric lymphadenopathy. --No pelvic or inguinal lymphadenopathy. Reproductive: Unremarkable Other: No ascites or free air. The abdominal wall is normal. Musculoskeletal. There appears to be some mild height loss of the T11 vertebral body, likely chronic. There is an age indeterminate fracture of the L3 transverse process on the right. This is favored to be chronic. IMPRESSION: 1. No acute intracranial process. 2. No acute cervical spine fracture or dislocation. 3. Right frontal scalp soft tissue swelling. 4. No acute maxillofacial fracture. 5. No acute intra-abdominal or pelvic injury. 6. Hepatic steatosis. 7. Horseshoe kidney. 8. Colonic diverticulosis without CT evidence for diverticulitis. 9. There is a chronic appearing L3 transverse process fracture on the right. 10. Focus of gas within the urinary bladder. Correlation with urinalysis and patient history is recommended. This may be secondary to prior instrumentation or an infection with a gas-forming organism. Electronically Signed: By: Katherine Mantle M.D. On: 03/17/2019 18:04   Ct L-spine No Charge  Result Date: 03/17/2019 CLINICAL DATA:  Back pain, minor trauma. Patient was found on the floor with sore on chest in left elbow and hematoma over the right eye. EXAM: CT LUMBAR SPINE WITHOUT CONTRAST TECHNIQUE: Multidetector CT imaging of the lumbar spine was performed without intravenous contrast administration. Multiplanar CT image reconstructions were also generated. COMPARISON:  None. FINDINGS: Segmentation: 5 non  rib-bearing lumbar type vertebral bodies are present. The lowest fully formed vertebral body is L5. Alignment: There is some straightening of the normal lumbar lordosis. No significant listhesis is present. Vertebrae: Vertebral body heights are maintained. Well corticated fragment at the right transverse process of L3 is most consistent with remote trauma. There is no evidence for acute fracture. Paraspinal and other soft tissues: A horseshoe kidney is noted. Visualized paraspinous soft tissues are otherwise unremarkable. Disc levels: Broad-based disc protrusions are present at L3-4, L4-5, and L5-S1. Disc material extends into the foramina at each of these levels. This contributes to mild foraminal narrowing bilaterally. No other significant disc disease or stenosis is evident. IMPRESSION: 1. No acute abnormality. 2. Remote fracture involving the right transverse process of L3. 3. 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 Electronically Signed   By: Marin Roberts M.D.   On: 03/17/2019 17:50   Dg Knee Complete 4 Views Left  Result Date: 03/17/2019 CLINICAL DATA:  Fall EXAM: LEFT KNEE - COMPLETE 4+ VIEW COMPARISON:  None. FINDINGS: No fracture or malalignment. Minimal medial and patellofemoral degenerative change. No large knee effusion IMPRESSION: No acute osseous abnormality Electronically Signed   By: Jasmine Pang M.D.   On: 03/17/2019 16:32   Dg Knee Complete 4 Views Right  Result Date: 03/17/2019 CLINICAL DATA:  Larey Seat in bathroom EXAM: RIGHT KNEE - COMPLETE 4+ VIEW COMPARISON:  None. FINDINGS: No fracture or malalignment. No large knee effusion. Joint spaces are relatively maintained IMPRESSION: No acute osseous abnormality Electronically Signed   By: Jasmine Pang M.D.   On: 03/17/2019 16:31   Ct Maxillofacial Wo Contrast  Addendum Date: 03/17/2019   ADDENDUM REPORT: 03/17/2019 18:08 ADDENDUM: There is intracranial atrophy  that is significantly greater than expected for the patient's recorded  age. Electronically Signed   By: Katherine Mantle M.D.   On: 03/17/2019 18:08   Result Date: 03/17/2019 CLINICAL DATA:  Acute pain due to trauma. Hematoma to the right eye. EXAM: CT HEAD WITHOUT CONTRAST CT MAXILLOFACIAL WITHOUT CONTRAST CT CERVICAL SPINE WITHOUT CONTRAST CT CHEST, ABDOMEN AND PELVIS WITH CONTRAST TECHNIQUE: Contiguous axial images were obtained from the base of the skull through the vertex without intravenous contrast. Multidetector CT imaging of the maxillofacial structures was performed. Multiplanar CT image reconstructions were also generated. A small metallic BB was placed on the right temple in order to reliably differentiate right from left. Multidetector CT imaging of the cervical spine was performed without intravenous contrast. Multiplanar CT image reconstructions were also generated. Multidetector CT imaging of the chest, abdomen and pelvis was performed following the standard protocol during bolus administration of intravenous contrast. CONTRAST:  80mL OMNIPAQUE IOHEXOL 300 MG/ML  SOLN COMPARISON:  CT head dated 08/16/2010 FINDINGS: CT HEAD: Brain: No evidence of acute infarction, hemorrhage, hydrocephalus, extra-axial collection or mass lesion/mass effect. There is advanced atrophy, greater than expected for the patient's stated age. Vascular: No hyperdense vessel or unexpected calcification. Skull: Normal. Negative for fracture or focal lesion. There is right frontal scalp swelling. Other: None. CT MAXILLOFACIAL FINDINGS Osseous: No fracture or mandibular dislocation. No destructive process. Orbits: Negative. No traumatic or inflammatory finding. Sinuses: Clear. Soft tissues: There is right periorbital soft tissue swelling. CT CERVICAL SPINE FINDINGS Alignment: Normal. Skull base and vertebrae: No acute fracture. No primary bone lesion or focal pathologic process. 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 There are multilevel degenerative changes throughout the  cervical spine, greatest at the C5-C6 level. There is osseous neural foraminal narrowing at the C4-C5 and C5-C6 levels bilaterally. Soft tissues and spinal canal: No prevertebral fluid or swelling. No visible canal hematoma. Disc levels: Multilevel disc height loss is noted throughout the cervical spine. Other: None. CT CHEST FINDINGS Cardiovascular: The thoracic aorta is unremarkable. Coronary artery calcifications are noted. The heart size is normal. There is no significant pericardial effusion. Mediastinum/Nodes: --No mediastinal or hilar lymphadenopathy. --No axillary lymphadenopathy. --No supraclavicular lymphadenopathy. --Normal thyroid gland. --The esophagus is unremarkable Lungs/Pleura: There is some mild atelectasis at the lung bases and within the right middle lobe. There is no pneumothorax. No significant pleural effusion. Musculoskeletal: No chest wall abnormality. No acute or significant osseous findings. CT ABDOMEN AND PELVIS FINDINGS Hepatobiliary: There is decreased hepatic attenuation suggestive of hepatic steatosis. Normal gallbladder.There is no biliary ductal dilation. Pancreas: Normal contours without ductal dilatation. No peripancreatic fluid collection. Spleen: No splenic laceration or hematoma. Adrenals/Urinary Tract: --Adrenal glands: No adrenal hemorrhage. 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 There are no radiopaque kidney stones. --Urinary bladder: There is a focus of gas within the urinary bladder. Stomach/Bowel: --Stomach/Duodenum: No hiatal hernia or other gastric abnormality. Normal duodenal course and caliber. --Small bowel: No dilatation or inflammation. --Colon: There is scattered colonic diverticula without CT evidence for diverticulitis. --Appendix: Not visualized. No right lower quadrant inflammation or free fluid. Vascular/Lymphatic: Normal course and caliber of the major abdominal vessels. --No retroperitoneal lymphadenopathy. --No mesenteric  lymphadenopathy. --No pelvic or inguinal lymphadenopathy. Reproductive: Unremarkable Other: No ascites or free air. The abdominal wall is normal. Musculoskeletal. There appears to be some mild height loss of the T11 vertebral body, likely chronic. There is an age indeterminate fracture of the L3 transverse process on the right. This  is favored to be chronic. IMPRESSION: 1. No acute intracranial process. 2. No acute cervical spine fracture or dislocation. 3. Right frontal scalp soft tissue swelling. 4. No acute maxillofacial fracture. 5. No acute intra-abdominal or pelvic injury. 6. Hepatic steatosis. 7. Horseshoe kidney. 8. Colonic diverticulosis without CT evidence for diverticulitis. 9. There is a chronic appearing L3 transverse process fracture on the right. 10. Focus of gas within the urinary bladder. Correlation with urinalysis and patient history is recommended. This may be secondary to prior instrumentation or an infection with a gas-forming organism. Electronically Signed: By: Katherine Mantle M.D. On: 03/17/2019 18:04     I have reviewed the above imaging : CT head, CT L-spine, T-spine and C-spine   ASSESSMENT AND PLAN   48 year old male admitted for rhabdomyolysis after attempted overdose with unusual presentation progressive left arm weakness and numbness.  Associated with swelling of the forearm.  Differentials include stroke, compressive myelopathy, deep vein thrombosis of the upper limb versus compartment syndrome. Stroke would be higher on the differential however patient states that this is gradually progressive. Myelopathy versus syrinx with lesion at the C5-C6 level makes more sense, patient also mildly brisk reflexes in lower extremities.  Patient also has left lower extremity weakness.  Given the swelling.  Compartment syndrome is a possibility however patient has weakness above the forearm swelling as well (biceps, triceps and deltoid).  Also does not complain of much pain.  DVT  would explain swelling but which would not explain mucus although patient may be exaggerating his symptoms.  Loss of reflexes in the left UE may be explained due to extremity edema making it difficult to elicit reflexes.   Impression Left upper extremity weakness and swelling  Recommendations Left upper extremity venous Doppler Obtain MRI of the brain and C-spine  Neurology will continue to follow  Sushanth Aroor [facility] Pager Number [phone]

## 2019-03-19 NOTE — Progress Notes (Addendum)
[id number]: paged on call IM resident DR. Winters.  0500: MD called back, notified pt's left arm has been swollen. It had NS running at 175ml/hr . I had switched iv site to another arm and d/ced iv from left wrist. Arm has been elevated all night. Its about the same since last night. HAS a blister in left wrist.Pt is able to move some fingers but not all. Pt stated his arm is tingling. No feeling in forearm, but can feel his left hand and LU arm when I touch. Able to move bilateral legs and right arm. Palpable radial pulse. Vitals stable.  MD stated to let IV team look at it and page back.  6438: IV team was on the floor. I notified IV team, 2 iv team RNS looked at patient and stated it not related to iv or infiltration.  0532: I paged DR. Winters back and notified what iv team had said. MD stated they are coming to check on pt.  34: two MDs by bedside. Will continue to monitor.

## 2019-03-19 NOTE — Progress Notes (Signed)
OT Cancellation Note  Patient Details Name: Kazimir Hartnett MRN: [id number] DOB: [birth year]   Cancelled Treatment:    Reason Eval/Treat Not Completed: Patient at procedure or test/ unavailable(Pt in room for test. OT unable to see pt x2 times.)  Pt had MRI of brain and C-spine MRI waiting for results. Pt was having arm evaluated with testing. OT to continue to see pt as schedule allows next available treatment day.  Ebony Hail Harold Hedge) Marsa Aris OTR/L Acute Rehabilitation Services Pager: [phone] Office: [place] 03/19/2019, 11:18 AM

## 2019-03-19 NOTE — Progress Notes (Signed)
Received a page for new onset L sided weakness and numbness at 5:30 AM. Dr. Maricela Bo and myself went to evaluate Clayton Baldwin. Per Mr. Weidinger, yesterday afternoon he began to notice subtle weakness in his left arm after his physical therapy session. He states that he noticed his grip worsening throughout the day, and began to lose feeling in his forearm. He describes the feeling in his arm as "tingly." He made his nurse aware after he could no longer move his LUE. He states that his weakness was typically seen in the RUE, but after moving the IV from his LUE to the R arm, he was able to move his R arm again. He states that his R arm weakness had occurred since his admission, but he can move it now, but his grip is still weak.    During the physical exam his cardiac exam was RRR, no murmurs, rubs, gallops and pulmonary was clear to auscultation bilaterally with no wheezes, rales, or rhonchi. He was noted to have several bullae in the L wrist and elbow.   Neuro Exam: Sensation was intact in the L hand to the wrist and shoulder, with loss of sensation in the forearm. His LUE strength was 0/5 with no bicep/tricep/brachialis reflexes elicited in the LUE, but this could be due to LUE swelling. His RUE exhibited full range of motion and sensation was intact. RUE strength 3/5. CN II-XII were intact bilaterally.   In the presence of new onset weakness in the LUE, absent reflexes paresthesia, and swelling we were concerned for neurological insults, upper extremity DVT, or compartment syndrome. With the constellation of symptoms, we are most concerned for a neurological insult, most concerning for cortical stroke or spinal compression.   With new onset of swelling, and hand weakness we considered possible compartment syndrome, even though there is no complaints of pain, which is atypical. We also considered a possible DVT of the LUE. While these diagnosis are being considered, neurological insult is currently higher on  the differential.   We consulted Neurology and met Dr. Lorraine Lax at bedside. He concurred with our findings, and we ordered an MRI brain, C spine, and a doppler.   Maudie Mercury, MD IMTS, PGY-1 Pager: [phone] 03/19/2019,7:01 AM

## 2019-03-20 ENCOUNTER — Encounter (HOSPITAL_COMMUNITY): Payer: Self-pay | Admitting: *Deleted

## 2019-03-20 DIAGNOSIS — S0591XA Unspecified injury of right eye and orbit, initial encounter: Secondary | ICD-10-CM

## 2019-03-20 DIAGNOSIS — R740 Nonspecific elevation of levels of transaminase and lactic acid dehydrogenase [LDH]: Secondary | ICD-10-CM

## 2019-03-20 DIAGNOSIS — R531 Weakness: Secondary | ICD-10-CM

## 2019-03-20 DIAGNOSIS — M4802 Spinal stenosis, cervical region: Secondary | ICD-10-CM

## 2019-03-20 DIAGNOSIS — T424X2A Poisoning by benzodiazepines, intentional self-harm, initial encounter: Secondary | ICD-10-CM

## 2019-03-20 DIAGNOSIS — F332 Major depressive disorder, recurrent severe without psychotic features: Secondary | ICD-10-CM

## 2019-03-20 DIAGNOSIS — M6282 Rhabdomyolysis: Secondary | ICD-10-CM

## 2019-03-20 DIAGNOSIS — M7989 Other specified soft tissue disorders: Secondary | ICD-10-CM

## 2019-03-20 DIAGNOSIS — F1990 Other psychoactive substance use, unspecified, uncomplicated: Secondary | ICD-10-CM

## 2019-03-20 LAB — BASIC METABOLIC PANEL
Anion gap: 6 (ref 5–15)
Anion gap: 9 (ref 5–15)
Anion gap: 6 (ref 5–15)
Anion gap: 7 (ref 5–15)
BUN: 12 mg/dL (ref 6–20)
BUN: 11 mg/dL (ref 6–20)
BUN: 10 mg/dL (ref 6–20)
BUN: 8 mg/dL (ref 6–20)
CO2: 23 mmol/L (ref 22–32)
CO2: 24 mmol/L (ref 22–32)
CO2: 26 mmol/L (ref 22–32)
CO2: 24 mmol/L (ref 22–32)
Calcium: 7.5 mg/dL — ABNORMAL LOW (ref 8.9–10.3)
Calcium: 7.4 mg/dL — ABNORMAL LOW (ref 8.9–10.3)
Calcium: 7.5 mg/dL — ABNORMAL LOW (ref 8.9–10.3)
Calcium: 7.4 mg/dL — ABNORMAL LOW (ref 8.9–10.3)
Chloride: 108 mmol/L (ref 98–111)
Chloride: 103 mmol/L (ref 98–111)
Chloride: 105 mmol/L (ref 98–111)
Chloride: 104 mmol/L (ref 98–111)
Creatinine, Ser: 0.73 mg/dL (ref 0.61–1.24)
Creatinine, Ser: 0.77 mg/dL (ref 0.61–1.24)
Creatinine, Ser: 0.78 mg/dL (ref 0.61–1.24)
Creatinine, Ser: 0.66 mg/dL (ref 0.61–1.24)
GFR calc Af Amer: >60 mL/min (ref >60)
GFR calc Af Amer: >60 mL/min (ref >60)
GFR calc Af Amer: >60 mL/min (ref >60)
GFR calc Af Amer: >60 mL/min (ref >60)
GFR calc non Af Amer: >60 mL/min (ref >60)
GFR calc non Af Amer: >60 mL/min (ref >60)
GFR calc non Af Amer: >60 mL/min (ref >60)
GFR calc non Af Amer: >60 mL/min (ref >60)
Glucose, Bld: 100 mg/dL — ABNORMAL HIGH (ref 70–99)
Glucose, Bld: 93 mg/dL (ref 70–99)
Glucose, Bld: 107 mg/dL — ABNORMAL HIGH (ref 70–99)
Glucose, Bld: 112 mg/dL — ABNORMAL HIGH (ref 70–99)
Potassium: 3.6 mmol/L (ref 3.5–5.1)
Potassium: 3.5 mmol/L (ref 3.5–5.1)
Potassium: 3.4 mmol/L — ABNORMAL LOW (ref 3.5–5.1)
Potassium: 3.6 mmol/L (ref 3.5–5.1)
Sodium: 137 mmol/L (ref 135–145)
Sodium: 136 mmol/L (ref 135–145)
Sodium: 137 mmol/L (ref 135–145)
Sodium: 135 mmol/L (ref 135–145)

## 2019-03-20 LAB — CBC
HCT: 34.4 % — ABNORMAL LOW (ref 39.0–52.0)
Hemoglobin: 12 g/dL — ABNORMAL LOW (ref 13.0–17.0)
MCH: 38 pg — ABNORMAL HIGH (ref 26.0–34.0)
MCHC: 34.9 g/dL (ref 30.0–36.0)
MCV: 108.9 fL — ABNORMAL HIGH (ref 80.0–100.0)
Platelets: 87 10*3/uL — ABNORMAL LOW (ref 150–400)
RBC: 3.16 MIL/uL — ABNORMAL LOW (ref 4.22–5.81)
RDW: 11.7 % (ref 11.5–15.5)
WBC: 5.1 10*3/uL (ref 4.0–10.5)
nRBC: 0 % (ref 0.0–0.2)

## 2019-03-20 LAB — URINE CULTURE: Culture: 1000 — AB

## 2019-03-20 LAB — CK
Total CK: 6940 U/L — ABNORMAL HIGH (ref 49–397)
Total CK: 4338 U/L — ABNORMAL HIGH (ref 49–397)

## 2019-03-20 LAB — GLUCOSE, CAPILLARY: Glucose-Capillary: 90 mg/dL (ref 70–99)

## 2019-03-20 MED ORDER — POTASSIUM CHLORIDE CRYS ER 20 MEQ PO TBCR
40.0000 meq | EXTENDED_RELEASE_TABLET | Freq: Once | ORAL | Status: AC
  Administered 2019-03-20: 15:00:00 40 meq via ORAL
  Filled 2019-03-20: qty 2

## 2019-03-20 NOTE — Progress Notes (Signed)
S: Ointment has been applied. No pain in the eye. Doing well.   O:  VAn:  OD: 20/60 equivalent (ointment) OS: 20/30 equivalent  Pupils: 3.5-41mm OU, no APD  IOP (tonopen): OD: 23 - squeezing OS: 22 - squeezing  OD: 1+ periorbital lid edema (upper) superficial abrasion on the lid, good orbicularis strength OS: no periorbital edema, no proptosis, V1-V3 intact and symmetric, good orbicularis strength  Pen Light Exam: L/L: OD: 1+ upper lid edema, blepharitis, no eyelid laceration OS: blepharitis  C/S: OD: Now flat 360 [place] - no bullous [place] OS: white and quiet  K: OD: No abnormal staining, ointment effect OS: clear, no abnormal staining  A/C: OD: grossly deep and quiet appearing by pen light OS: grossly deep and quiet appearing by pen light  I: OD: round and regular, no peaked/irregular pupil OS: round and regular  L: OD: Trace NSC OS: Trace NSC  A/P: 1. Subconjunctival Hematomas with Subconj Heme - Much improved, just 360 subconj heme - will take 1-2 weeks for appearance to resolve - Can use Artificial tears PRN irritation (I did not order)  2. 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 Please call with any concerns.   Marshall Cork, MD,MPH Ophthalmology [phone]

## 2019-03-20 NOTE — Plan of Care (Signed)
Poc progressing.  

## 2019-03-20 NOTE — Consult Note (Signed)
[place] Nurse wound consult note Reason for Consult: partial thickness skin loss to abdomen.  Blistering x 2 from prolonged period "down" while unresponsive.  Wound type:trauma Pressure Injury POA: Yes  Pressure from lying unresponsive for a prolonged period Measurement: proximal:  3 cmx  2 cm x 0.2 cm  Distal:  4 cm x 2 cmx 0.2 cm WOund KGU:RKYH and moist Drainage (amount, consistency, odor) minimal serosanguinous.  Periwound:intact Dressing procedure/placement/frequency:Cleanse blisters to abdomen with NS and pat dry.  Apply Xeroform to wound bed.  Cover with foam dressing.  Change Monday and Thursday.   Will not follow at this time.  Please re-consult if needed.  Domenic Moras MSN, RN, FNP-BC [facility]ON Wound, Ostomy, Continence Nurse Pager [phone]

## 2019-03-20 NOTE — Progress Notes (Addendum)
NEUROLOGY PROGRESS NOTE  Subjective: At this point patient does not feel any improvement with his left arm strength but is improved sensation.   Exam: Vitals:   03/20/19 0001 03/20/19 0449  BP: (!) 148/94 (!) 149/87  Pulse: 79 75  Resp: 13 15  Temp: 99.7 F (37.6 C) 99.6 F (37.6 C)  SpO2: 99% 100%  Physical Exam  HEENT-  Normocephalic, no lesions, without obvious abnormality.     Extremities- Warm, dry and intact Musculoskeletal-no joint tenderness, deformity or swelling Skin-warm and dry, no hyperpigmentation, vitiligo, or suspicious lesions Neuro:  Mental Status: Alert, oriented, thought content appropriate.  Speech fluent without evidence of aphasia.  Able to follow 3 step commands without difficulty. Cranial Nerves: II:  Visual fields grossly normal,  III,IV, VI: Right eyelid continues to to be edematous., extra-ocular motions intact bilaterally pupils equal, round, reactive to light and accommodation V,VII: smile symmetric, facial light touch sensation normal bilaterally VIII: hearing normal bilaterally IX,X: Palate rises midline XI: bilateral shoulder shrug XII: midline tongue extension Motor: Right : Upper extremity   5/5    Left:     Upper extremity   5/5 -Patient is unable to move his left arm from forearm to hand.  He has 1/5 strength with bicep flexion.  He is able to show 3/5 strength when I asked him to push down from a shoulder abduction however he cannot lift his shoulder up.  Bilateral lower extremities show 4/5 strength and patient shows significant effort when trying to lift the leg. Tone and bulk:normal tone throughout; no atrophy noted Sensory: Decreased sensation in his left upper extremity but improved from before.  Otherwise normal Deep Tendon Reflexes: 2+ and symmetric throughout left lower and left upper extremities with no Achilles reflex.  Patient's right knee jerk is very brisk at 3+ but no clonus in addition his right brachioradialis and bicep is 3+  with no clonus.  I could not elicit any clonus in bilateral ankles Plantars: Right: downgoing   Left: downgoing Cerebellar: normal finger-to-nose on the left   Medications:  Scheduled: . folic acid  1 mg Oral Daily  . multivitamin with minerals  1 tablet Oral Daily  . pneumococcal 23 valent vaccine  0.5 mL Intramuscular Once  . thiamine  100 mg Oral Daily   Or  . thiamine  100 mg Intravenous Daily  . vitamin B-12  1,000 mcg Oral Daily   Continuous: . sodium chloride 250 mL/hr at 03/19/19 2009   Pertinent Labs/Diagnostics:  Mr Brain Wo Contrast Result Date: 03/19/2019  IMPRESSION: 1. No acute intracranial finding. 2. 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 Electronically Signed   By: Marnee SpringJonathon  Watts M.D.   On: 03/19/2019 12:11   Mr Cervical Spine Wo Contrast Result Date: 03/19/2019 CLINICAL DATA:  Subacute neuro deficit. Left arm weakness and numbnessIMPRESSION: 1. Strain of left levator scapulae and trapezius. Right-sided supraspinatus strain. 2. 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 Electronically Signed   By: Marnee SpringJonathon  Watts M.D.   On: 03/19/2019 12:30   [place] Upper Extremity Venous Duplex  Result Date: 03/20/2019 UPPER VENOUS STUDY  Indications:   Summary:  Right: No evidence of thrombosis in the subclavian.  Left: No evidence of deep vein thrombosis in the upper extremity. No evidence of superficial vein thrombosis in the upper extremity. Interstitial edema noted throughout.  *See table(s) above for measurements and observations.  Diagnosing physician: Harold Barban MD Electronically signed by Harold Barban MD on 03/20/2019 at 1:10:57 AM.    Final    -- Etta Quill PA-C [facility] [phone]  Assessment: Left arm plegia/paralysis along with right-sided hyperreflexia.  MRI of cervical spine does show cord flattening at the C5-6 and  C6-7.  Patient with some improvement in sensation but not motor function on left arm.   Neurosurgery has examined the patient and does not feel this is surgical at this time and recommends PT OT.  Etiology remains unclear at this time.  Recommendations: -Consider left shoulder and brachial plexus MRI (could not help explain lower ext weakness, but might be a test for evaluate the left UE weakness).  -We will need nerve conduction EMG as an outpatient -We will need PT/OT  Neurology will be available as needed. Will follow up on the MRI brachial plexus/shoulder.  03/20/2019, 11:25 AM  Attending Neurohospitalist Addendum Patient seen and examined with APP/Resident. Agree with the history and physical as documented above. Agree with the plan as documented, which I helped formulate. I have independently reviewed the chart, obtained history, review of systems and examined the patient.I have personally reviewed pertinent head/neck/spine imaging (CT/MRI). Please feel free to call with any questions. --- Amie Portland, MD [facility] Pager: [phone]  If 7pm to 7am, please call on call as listed on AMION.

## 2019-03-20 NOTE — Progress Notes (Signed)
Subjective: Pt seen at the bedside on rounds this AM while eating breakfast.  Patient says he feels well this morning but he continues to have left arm weakness.  No other complaints today.  Objective:  Vital signs in last 24 hours: Vitals:   03/19/19 2020 03/20/19 0001 03/20/19 0449 03/20/19 0645  BP: (!) 152/94 (!) 148/94    Pulse: 79 79    Resp: 15 13    Temp: 100 F (37.8 C) 99.7 F (37.6 C) 99.6 F (37.6 C)   TempSrc: Oral Oral Oral   SpO2: 99% 99%    Weight:    93.3 kg  Height:       Physical Exam: General: Comfortable appearing, NAD HEENT: Right eye bruising and swelling  CV: RRR, normal S1 and S2 no murmurs, rubs or gallops appreciated PULM: Normal work of breathing NEURO: LUE 0/5 strength, LUE sensation diminished LLE 4/5 with hip flexion, 5/5 strength in all other extremities  Assessment/Plan:  Principal Problem:   Major depressive disorder, recurrent episode, severe (HCC) Active Problems:   Rhabdomyolysis   Suicide attempt (HCC)   Thrombocytopenia (HCC)   AKI (acute kidney injury) (HCC)   Hematoma of eyelid   Alcohol use disorder, severe, dependence (HCC)   Subconjunctival hemorrhage, traumatic, right  In summary, Mr. Illene LabradorMorrell is a 48 year old male with a history of HTN, polysubstance use disorder, depression and hepatic steatosis who presented to the ER after being found down at home for unknown known amount of time. He is suspected to have been down for 4 days after taking Lorazepam, trazodone and alcohol as an attempted suicide. Pt was admitted for rhabdomyolysis with CK greater than 8000, L eye injury, and thrombocytopenia of 65.   #Depression with SI #Suicide Attempt #Polysubstance Use Disorder: Psychiatry saw the patient yesterday and recommends inpatient psych to manage SI. They further advised not utilizing antidepressants or antipsychotics due to CKs below 3000 with the exception of the CIWA. - Continue CIWA protocol    #Rhabdomyolysis: Improving,  mild initially 8252, now 6940.  We will continue to trend his CK, BMP and adjust fluids as indicated. Serum creatinine is returned to normal.  - Continue trendending CK levels every 12 hours - BMPs q6hrs - IVF 250 mL/hour NS - Encourage PO intake  #LUE Swelling #LUE Weakness: Patient continues to have 0/5 strength and numbness in the LUE. LUE Doppler study negative for DVT. MRI of his cervical spine which was notable for degenerative spinal stenosis and by for minimal impingement from C3-4, C6-7 and flattening of the spinal cord at C5-7. Neurology & Neurosurgery as seen the patient and reccomendations are below. - PT/OT evaluation  - Address cervical spondylosis and stenosis after he is recovered from this current injury.  - May consider L shoulder brachial plexus MRI and/or EMG studies as an outpatient   #Hx of Keratoconus #R Eye Traumatic Injury: Seen by Dr. Alben SpittleWeaver with ophthalmology. Will appreciate recommendations. - Erythromycin ointment x5 days - Artificial tears PRN  #Transaminitis  #Hepatic steatosis: Patient statesthat he had hepatitis as a child.Was worked up in 2017 by GI for transaminitis, but was negative for viral hepatitis.Patient had significantly elevated ferritin levels but was negative for hemochromatosis. His anti-smooth muscle antibody titer was 1:28 and GI wanted to do a liver biopsy but was lost to follow-up. Patient's initial labs including AST:ALT - 207:52, elevated INR, and CT findings of hepatic steatosis are suggestive to alcohol-related hepatic injury. - Monitor w/ daily CMP - Consider GI f/u in  the outpatient setting  #Thrombocytopenia:Chronic liver dz vs. Alcohol induced. 65 on admission. Improved to 87 today. - Monitor with daily CBC  #FEN/GI - Regular diet - Continue IV NS 250 cc/hr   #DVT prophylaxis: Enoxaparin - Lovenox subq injections   #Code: Full  #Dispo: Pending medical course. Will need transfer to inpatient psych for ongoing  management of SI.    Earlene Plater, MD Internal Medicine, PGY1 Pager: [phone]  03/20/2019,12:50 PM

## 2019-03-20 NOTE — Evaluation (Signed)
Occupational Therapy Evaluation Patient Details Name: Clayton Baldwin MRN: [id number] DOB: October 12, 1970 Today's Date: 03/20/2019    History of Present Illness Patient is a 48 year old male with a history of HTN, Alcohol use disorder, Major depression, and hepatic steatosis who presented with EMS after being found down at his home four days after he overdosed on Lorazepam, trazodone and alcohol in a suicide attempt.   Clinical Impression   Patient presenting with decreased I in self care, functional mobility/transfers, balance, endurance, strength, cognition, and safety awareness. Pt 2-/5 throughout L UE and reports feeling of heavy.. Patient reports being independent and living alone PTA. Patient currently functioning mod- total A. Pt attempting to stand from bed with therapist but unable to clear bed. Pt very anxious with movement and HR increased to 160 bpm. Patient will benefit from acute OT to increase overall independence in the areas of ADLs, functional mobility, and safety awareness in order to safely discharge to next venue of care.    Follow Up Recommendations  Supervision/Assistance - 24 hour(follow up OT at psychiatric hospital)    Equipment Recommendations  None recommended by OT       Precautions / Restrictions Precautions Precautions: Fall Precaution Comments: Watch HR      Mobility Bed Mobility Overal bed mobility: Needs Assistance Bed Mobility: Rolling;Sit to Supine;Supine to Sit Rolling: Mod assist   Supine to sit: Max assist Sit to supine: Mod assist   General bed mobility comments: Pt needing assistance with L LE and trunk for bed mobility  Transfers Overall transfer level: Needs assistance      General transfer comment: Pt attempted sit <>stand but unable to clear buttocks from bed. Pt very anxious and fearful- HR increased to 160    Balance Overall balance assessment: Needs assistance Sitting-balance support: Feet supported Sitting balance-Leahy Scale:  Fair Sitting balance - Comments: S - min guard for safety seated on EOB         ADL either performed or assessed with clinical judgement   ADL Overall ADL's : Needs assistance/impaired Eating/Feeding: Set up;Sitting   Grooming: Wash/dry hands;Wash/dry face;Oral care;Sitting;Set up   Upper Body Bathing: Moderate assistance;Sitting   Lower Body Bathing: Maximal assistance;Bed level;Total assistance   Upper Body Dressing : Moderate assistance;Sitting   Lower Body Dressing: Maximal assistance;Total assistance;Bed level                Vision Baseline Vision/History: Wears glasses Wears Glasses: At all times Patient Visual Report: No change from baseline Vision Assessment?: No apparent visual deficits Additional Comments: R eye weakness but pt does not report blurred vision at this time            Pertinent Vitals/Pain Pain Assessment: Faces Faces Pain Scale: No hurt     Hand Dominance Right   Extremity/Trunk Assessment Upper Extremity Assessment Upper Extremity Assessment: RUE deficits/detail RUE Deficits / Details: 3/5 gross strength overall LUE Deficits / Details: AAROM/PROM grossly WFL, negative grip, trace movement in shoulder and slightly more in elbow   Lower Extremity Assessment Lower Extremity Assessment: Defer to PT evaluation       Communication Communication Communication: No difficulties   Cognition Arousal/Alertness: Awake/alert Behavior During Therapy: Flat affect Overall Cognitive Status: No family/caregiver present to determine baseline cognitive functioning Area of Impairment: Safety/judgement;Problem solving     Following Commands: Follows one step commands consistently Safety/Judgement: Decreased awareness of deficits   Problem Solving: Slow processing;Decreased initiation;Difficulty sequencing;Requires tactile cues;Requires verbal cues  Home Living Family/patient expects to be discharged to:: Private  residence Living Arrangements: Alone   Type of Home: House Home Access: Stairs to enter Secretary/administratorntrance Stairs-Number of Steps: 4 Entrance Stairs-Rails: Right Home Layout: One level     Bathroom Shower/Tub: Chief Strategy OfficerTub/shower unit   Bathroom Toilet: Standard     Home Equipment: None          Prior Functioning/Environment Level of Independence: Independent           OT Problem List: Decreased strength;Decreased knowledge of use of DME or AE;Decreased range of motion;Decreased coordination;Decreased knowledge of precautions;Decreased activity tolerance;Decreased cognition;Impaired UE functional use;Impaired balance (sitting and/or standing);Decreased safety awareness;Pain      OT Treatment/Interventions: Self-care/ADL training;Balance training;Therapeutic exercise;Neuromuscular education;Therapeutic activities;Energy conservation;Cognitive remediation/compensation;DME and/or AE instruction;Manual therapy;Patient/family education    OT Goals(Current goals can be found in the care plan section) Acute Rehab OT Goals Patient Stated Goal: none stated ADL Goals Pt Will Perform Grooming: with set-up Pt Will Perform Upper Body Bathing: with set-up Pt Will Perform Lower Body Bathing: with min assist Pt Will Perform Upper Body Dressing: with supervision Pt Will Perform Lower Body Dressing: with min assist Pt Will Transfer to Toilet: with min assist Pt Will Perform Toileting - Clothing Manipulation and hygiene: with mod assist  OT Frequency: Min 2X/week   Barriers to D/C: Decreased caregiver support             AM-PAC OT "6 Clicks" Daily Activity     Outcome Measure Help from another person eating meals?: A Little Help from another person taking care of personal grooming?: A Little Help from another person toileting, which includes using toliet, bedpan, or urinal?: A Lot Help from another person bathing (including washing, rinsing, drying)?: A Lot Help from another person to put on and  taking off regular upper body clothing?: A Lot Help from another person to put on and taking off regular lower body clothing?: Total 6 Click Score: 13   End of Session    Activity Tolerance: Patient tolerated treatment well Patient left: in bed;with call bell/phone within reach;with bed alarm set;with nursing/sitter in room  OT Visit Diagnosis: Muscle weakness (generalized) (M62.81);History of falling (Z91.81);Unsteadiness on feet (R26.81)                Time: [id number] OT Time Calculation (min): 20 min Charges:  OT General Charges $OT Visit: 1 Visit   Alen BleacherBradsher, Clevon Khader P MS, OTR/L 03/20/2019, 3:39 PM

## 2019-03-20 NOTE — Progress Notes (Addendum)
During assessment, pt stated his left arm is about the same as this morning. Left arm still tingly but not getting worse. HE could shrug his left shoulder but could not lift the arm. Was able to barely wiggle some fingers but not all. Fore arm sensation is still not back, but could feel touch in his left hand and left upper arm. Left arm remains swollen and elevated. Blisters in left wrist and elbow. Will continue to monitor.

## 2019-03-20 NOTE — Consult Note (Signed)
Reason for Consult: Left upper extremity weakness, cervical spondylosis, cervical stenosis Referring Physician: Teaching service  Clayton Baldwin is an 48 y.o. male.  HPI: The patient is a 48 year old white male with history of depression, alcohol abuse, and attempted suicide.  He was admitted with rhabdomyolysis.  He was noted to have left upper extremity weakness.  He was seen by Dr. Laurence Slate, neurology and worked up further with a cervical MRI which demonstrated stenosis.  A neurosurgical consultation was requested.  Presently the patient is alert and pleasant.  He took a fall striking his right eye.  He complains that he is unable to move his left upper extremity.  He also has noted some sensory changes in his left upper extremity.  He denies neck pain.  Past Medical History:  Diagnosis Date  . Depression   . Hypertension   . Insomnia     Past Surgical History:  Procedure Laterality Date  . APPENDECTOMY      Family History  Problem Relation Age of Onset  . Liver disease Other   . Colon cancer Neg Hx   . Colon polyps Neg Hx     Social History:  reports that he has never smoked. He does not have any smokeless tobacco history on file. He reports current alcohol use. He reports that he does not use drugs.  Allergies:  Allergies  Allergen Reactions  . Grass Extracts [Gramineae Pollens] Itching and Other (See Comments)    Wheezing, also    Medications:  I have reviewed the patient's current medications. Prior to Admission:  Medications Prior to Admission  Medication Sig Dispense Refill Last Dose  . cetirizine (KLS ALLER-TEC) 10 MG tablet Take 10 mg by mouth daily.   Past Week at Unknown time  . ibuprofen (ADVIL) 200 MG tablet Take 400 mg by mouth every 6 (six) hours as needed for headache or mild pain.   Past Week at Unknown time  . NON FORMULARY Take 1 capsule by mouth See admin instructions. [employer] Adult 50+ Softgels- Take 1 softgel by mouth once a day    Past Week at Unknown time  . traZODone (DESYREL) 150 MG tablet Take 150 mg by mouth at bedtime as needed for sleep.    Past Week at Unknown time  . allopurinol (ZYLOPRIM) 100 MG tablet Take 100 mg by mouth daily.   Not Taking at See note  . amLODipine (NORVASC) 10 MG tablet Take 10 mg by mouth daily.   Not Taking at See note  . escitalopram (LEXAPRO) 20 MG tablet Take 20 mg by mouth daily.   Not Taking at See note  . olmesartan (BENICAR) 40 MG tablet Take 40 mg by mouth daily.   Not Taking at See note  . propranolol (INNOPRAN XL) 80 MG 24 hr capsule Take 80 mg by mouth at bedtime.   Not Taking at See note   Scheduled: . erythromycin   Right Eye QID  . folic acid  1 mg Oral Daily  . multivitamin with minerals  1 tablet Oral Daily  . pneumococcal 23 valent vaccine  0.5 mL Intramuscular Once  . thiamine  100 mg Oral Daily   Or  . thiamine  100 mg Intravenous Daily  . vitamin B-12  1,000 mcg Oral Daily   Continuous: . sodium chloride 250 mL/hr at 03/19/19 2009   ZOX:[id number], calcium carbonate, labetalol, LORazepam **OR** LORazepam Anti-infectives (From admission, onward)   Start     Dose/Rate Route Frequency Ordered Stop  03/18/19 1630  vancomycin (VANCOCIN) 1,500 mg in sodium chloride 0.9 % 500 mL IVPB  Status:  Discontinued     1,500 mg 250 mL/hr over 120 Minutes Intravenous Every 24 hours 03/17/19 1638 03/17/19 2146   03/18/19 0430  ceFEPIme (MAXIPIME) 2 g in sodium chloride 0.9 % 100 mL IVPB  Status:  Discontinued     2 g 200 mL/hr over 30 Minutes Intravenous Every 12 hours 03/17/19 1638 03/17/19 2146   03/17/19 1545  ceFEPIme (MAXIPIME) 2 g in sodium chloride 0.9 % 100 mL IVPB     2 g 200 mL/hr over 30 Minutes Intravenous  Once 03/17/19 1533 03/17/19 1621   03/17/19 1545  metroNIDAZOLE (FLAGYL) IVPB 500 mg     500 mg 100 mL/hr over 60 Minutes Intravenous  Once 03/17/19 1533 03/17/19 1727   03/17/19 1545  vancomycin (VANCOCIN) IVPB 1000 mg/200 mL premix  Status:   Discontinued     1,000 mg 200 mL/hr over 60 Minutes Intravenous  Once 03/17/19 1533 03/17/19 1539   03/17/19 1545  vancomycin (VANCOCIN) 2,500 mg in sodium chloride 0.9 % 500 mL IVPB     2,500 mg 250 mL/hr over 120 Minutes Intravenous  Once 03/17/19 1538 03/17/19 1823       Results for orders placed or performed during the hospital encounter of 03/17/19 (from the past 48 hour(s))  Acetaminophen level     Status: Abnormal   Collection Time: 03/18/19 10:10 AM  Result Value Ref Range   Acetaminophen (Tylenol), Serum <10 (L) 10 - 30 ug/mL    Comment: (NOTE) Therapeutic concentrations vary significantly. A range of 10-30 ug/mL  may be an effective concentration for many patients. However, some  are best treated at concentrations outside of this range. Acetaminophen concentrations >150 ug/mL at 4 hours after ingestion  and >50 ug/mL at 12 hours after ingestion are often associated with  toxic reactions. Performed at [facility], [address] [place], [place] [address]   Basic metabolic panel     Status: Abnormal   Collection Time: 03/18/19 12:02 PM  Result Value Ref Range   Sodium 144 135 - 145 mmol/L   Potassium 3.6 3.5 - 5.1 mmol/L   Chloride 113 (H) 98 - 111 mmol/L   CO2 24 22 - 32 mmol/L   Glucose, Bld 108 (H) 70 - 99 mg/dL   BUN 31 (H) 6 - 20 mg/dL   Creatinine, Ser 6.04 0.61 - 1.24 mg/dL   Calcium 7.4 (L) 8.9 - 10.3 mg/dL   GFR calc non Af Amer >60 >60 mL/min   GFR calc Af Amer >60 >60 mL/min   Anion gap 7 5 - 15    Comment: Performed at [facility], [address] [place], [place] [address]  Basic metabolic panel     Status: Abnormal   Collection Time: 03/18/19  4:19 PM  Result Value Ref Range   Sodium 141 135 - 145 mmol/L   Potassium 3.8 3.5 - 5.1 mmol/L   Chloride 113 (H) 98 - 111 mmol/L   CO2 23 22 - 32 mmol/L   Glucose, Bld 132 (H) 70 - 99 mg/dL   BUN 29 (H) 6 - 20 mg/dL   Creatinine, Ser 1.19 0.61 - 1.24 mg/dL   Calcium 7.2 (L) 8.9 - 10.3  mg/dL   GFR calc non Af Amer >60 >60 mL/min   GFR calc Af Amer >60 >60 mL/min   Anion gap 5 5 - 15  Comment: Performed at [facility], [address] [place], [place] [address]  CK     Status: Abnormal   Collection Time: 03/18/19  4:19 PM  Result Value Ref Range   Total CK 4,365 (H) 49 - 397 U/L    Comment: RESULTS CONFIRMED BY MANUAL DILUTION Performed at [facility], [address] [place], [place] [address]   CK     Status: Abnormal   Collection Time: 03/18/19 11:04 PM  Result Value Ref Range   Total CK 5,150 (H) 49 - 397 U/L    Comment: RESULTS CONFIRMED BY MANUAL DILUTION Performed at [facility], [address] [place], [place] [address]   Glucose, capillary     Status: None   Collection Time: 03/19/19  6:51 AM  Result Value Ref Range   Glucose-Capillary 96 70 - 99 mg/dL  CK     Status: Abnormal   Collection Time: 03/19/19 10:58 AM  Result Value Ref Range   Total CK 8,701 (H) 49 - 397 U/L    Comment: RESULTS CONFIRMED BY MANUAL DILUTION Performed at [facility], [address] [place], [place] [address]   Basic metabolic panel     Status: Abnormal   Collection Time: 03/19/19 10:58 AM  Result Value Ref Range   Sodium 139 135 - 145 mmol/L   Potassium 3.5 3.5 - 5.1 mmol/L   Chloride 111 98 - 111 mmol/L   CO2 20 (L) 22 - 32 mmol/L   Glucose, Bld 131 (H) 70 - 99 mg/dL   BUN 19 6 - 20 mg/dL   Creatinine, Ser 2.950.99 0.61 - 1.24 mg/dL   Calcium 7.7 (L) 8.9 - 10.3 mg/dL   GFR calc non Af Amer >60 >60 mL/min   GFR calc Af Amer >60 >60 mL/min   Anion gap 8 5 - 15    Comment: Performed at [facility], [address] [place], [place] [address]  Hepatic function panel     Status: Abnormal   Collection Time: 03/19/19 10:58 AM  Result Value Ref Range   Total Protein 4.5 (L) 6.5 - 8.1 g/dL   Albumin 2.0 (L) 3.5 - 5.0 g/dL   AST 865289 (H) 15 - 41 U/L   ALT 59 (H) 0 - 44 U/L   Alkaline Phosphatase 27 (L) 38 - 126 U/L   Total Bilirubin 1.4  (H) 0.3 - 1.2 mg/dL   Bilirubin, Direct 0.5 (H) 0.0 - 0.2 mg/dL   Indirect Bilirubin 0.9 0.3 - 0.9 mg/dL    Comment: Performed at [facility], [address] [place], [place] [address]  CBC     Status: Abnormal   Collection Time: 03/19/19  1:58 PM  Result Value Ref Range   WBC 5.4 4.0 - 10.5 K/uL   RBC 3.12 (L) 4.22 - 5.81 MIL/uL   Hemoglobin 12.0 (L) 13.0 - 17.0 g/dL   HCT 62.934.7 (L) 52.839.0 - 41.352.0 %   MCV 111.2 (H) 80.0 - 100.0 fL   MCH 38.5 (H) 26.0 - 34.0 pg   MCHC 34.6 30.0 - 36.0 g/dL   RDW 24.412.1 01.011.5 - 27.215.5 %   Platelets 69 (L) 150 - 400 K/uL    Comment: REPEATED TO VERIFY SPECIMEN CHECKED FOR CLOTS Immature Platelet Fraction may be clinically indicated, consider ordering this additional test [id number] CONSISTENT WITH PREVIOUS RESULT    nRBC 0.0 0.0 - 0.2 %    Comment: Performed at [facility], [address] [place], [place] [address]  Glucose, capillary     Status: None   Collection Time: 03/19/19  4:38 PM  Result Value Ref Range   Glucose-Capillary 98 70 - 99 mg/dL  CK     Status: Abnormal   Collection Time: 03/19/19  6:07 PM  Result Value Ref Range   Total CK 8,071 (H) 49 - 397 U/L    Comment: RESULTS CONFIRMED BY MANUAL DILUTION Performed at [facility], [address] [place], [place] [address]   Basic metabolic panel     Status: Abnormal   Collection Time: 03/19/19  6:26 PM  Result Value Ref Range   Sodium 138 135 - 145 mmol/L   Potassium 3.4 (L) 3.5 - 5.1 mmol/L   Chloride 108 98 - 111 mmol/L   CO2 21 (L) 22 - 32 mmol/L   Glucose, Bld 97 70 - 99 mg/dL   BUN 15 6 - 20 mg/dL   Creatinine, Ser 0.980.80 0.61 - 1.24 mg/dL   Calcium 7.7 (L) 8.9 - 10.3 mg/dL   GFR calc non Af Amer >60 >60 mL/min   GFR calc Af Amer >60 >60 mL/min   Anion gap 9 5 - 15    Comment: Performed at [facility], [address] [place], [place] [address]  Basic metabolic panel     Status: Abnormal   Collection Time: 03/20/19 12:21 AM  Result Value Ref Range    Sodium 137 135 - 145 mmol/L   Potassium 3.6 3.5 - 5.1 mmol/L   Chloride 108 98 - 111 mmol/L   CO2 23 22 - 32 mmol/L   Glucose, Bld 100 (H) 70 - 99 mg/dL   BUN 12 6 - 20 mg/dL   Creatinine, Ser 7.820.73 0.61 - 1.24 mg/dL   Calcium 7.5 (L) 8.9 - 10.3 mg/dL   GFR calc non Af Amer >60 >60 mL/min   GFR calc Af Amer >60 >60 mL/min   Anion gap 6 5 - 15    Comment: Performed at [facility], [address] [place], [place] [address]  CBC     Status: Abnormal   Collection Time: 03/20/19  6:20 AM  Result Value Ref Range   WBC 5.1 4.0 - 10.5 K/uL   RBC 3.16 (L) 4.22 - 5.81 MIL/uL   Hemoglobin 12.0 (L) 13.0 - 17.0 g/dL   HCT 30.834.4 (L) 65.739.0 - 84.652.0 %   MCV 108.9 (H) 80.0 - 100.0 fL   MCH 38.0 (H) 26.0 - 34.0 pg   MCHC 34.9 30.0 - 36.0 g/dL   RDW 96.211.7 95.211.5 - 84.115.5 %   Platelets 87 (L) 150 - 400 K/uL    Comment: REPEATED TO VERIFY Immature Platelet Fraction may be clinically indicated, consider ordering this additional test [id number] CONSISTENT WITH PREVIOUS RESULT    nRBC 0.0 0.0 - 0.2 %    Comment: Performed at [facility], [address] [place], [place] [address]  Basic metabolic panel     Status: Abnormal   Collection Time: 03/20/19  6:20 AM  Result Value Ref Range   Sodium 136 135 - 145 mmol/L   Potassium 3.5 3.5 - 5.1 mmol/L   Chloride 103 98 - 111 mmol/L   CO2 24 22 - 32 mmol/L   Glucose, Bld 93 70 - 99 mg/dL   BUN 11 6 - 20 mg/dL   Creatinine, Ser 6.440.77 0.61 - 1.24 mg/dL   Calcium 7.4 (L) 8.9 -  10.3 mg/dL   GFR calc non Af Amer >60 >60 mL/min   GFR calc Af Amer >60 >60 mL/min   Anion gap 9 5 - 15    Comment: Performed at [facility], [address], [place], [place] [address]  Glucose, capillary     Status: None   Collection Time: 03/20/19  6:29 AM  Result Value Ref Range   Glucose-Capillary 90 70 - 99 mg/dL    Mr Brain Wo Contrast  Result Date: 03/19/2019 CLINICAL DATA:  Left arm weakness and numbness EXAM: MRI HEAD WITHOUT CONTRAST TECHNIQUE:  Multiplanar, multiecho pulse sequences of the brain and surrounding structures were obtained without intravenous contrast. COMPARISON:  Head CT from 2 days ago FINDINGS: Brain: No acute infarction, hemorrhage, hydrocephalus, extra-axial collection or mass lesion. Brain atrophy that is significantly age advanced. Cerebellum is most severely affected, which may be related to history of alcohol abuse Vascular: Major flow voids are preserved Skull and upper cervical spine: Negative for marrow lesion Sinuses/Orbits: Negative Other: Right face and scalp swelling correlating with history of recent fall. IMPRESSION: 1. No acute intracranial finding. 2. 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 Electronically Signed   By: Marnee Spring M.D.   On: 03/19/2019 12:11   Mr Cervical Spine Wo Contrast  Result Date: 03/19/2019 CLINICAL DATA:  Subacute neuro deficit. Left arm weakness and numbness EXAM: MRI CERVICAL SPINE WITHOUT CONTRAST TECHNIQUE: Multiplanar, multisequence MR imaging of the cervical spine was performed. No intravenous contrast was administered. COMPARISON:  Cervical spine CT from 2 days ago FINDINGS: Alignment: No traumatic malalignment. Vertebrae: No occult fracture. Cord: Normal signal.  Degenerative deformity described below Posterior Fossa, vertebral arteries, paraspinal tissues: Posterior fossa as described on dedicated brain MRI. There is expansion and edematous signal within the left levator scapulae and upper trapezius. Supraspinatus on the right also appears edematous and there is adjacent fat edema. Disc levels: C2-3: Mild left facet spurring.  No impingement C3-4: Disc narrowing with endplate and uncovertebral ridging. Mild spinal stenosis. Biforaminal impingement C4-5: Disc narrowing with endplate and uncovertebral ridging. Advanced right and moderate left foraminal stenosis. Mild spinal stenosis. C5-6: Disc narrowing with endplate and  uncovertebral ridging and disc bulging. There is spinal stenosis with cord flattening. Advanced right more than left foraminal impingement C6-7: Disc narrowing with central up turning protrusion. Negative facets. Uncovertebral spurring causes moderate bilateral foraminal narrowing. C7-T1:Unremarkable. IMPRESSION: 1. Strain of left levator scapulae and trapezius. Right-sided supraspinatus strain. 2. 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 Electronically Signed   By: Marnee Spring M.D.   On: 03/19/2019 12:30   Vas [place] Upper Extremity Venous Duplex  Result Date: 03/20/2019 UPPER VENOUS STUDY  Indications: Edema Comparison Study: No prior study on file Performing Technologist: Sherren Kerns RVS  Examination Guidelines: A complete evaluation includes B-mode imaging, spectral Doppler, color Doppler, and power Doppler as needed of all accessible portions of each vessel. Bilateral testing is considered an integral part of a complete examination. Limited examinations for reoccurring indications may be performed as noted.  Right Findings: +----------+------------+---------+-----------+----------+-------+ RIGHT     CompressiblePhasicitySpontaneousPropertiesSummary +----------+------------+---------+-----------+----------+-------+ Subclavian               Yes       Yes                      +----------+------------+---------+-----------+----------+-------+  Left Findings: +----------+------------+---------+-----------+----------+--------------+ LEFT  CompressiblePhasicitySpontaneousProperties   Summary     +----------+------------+---------+-----------+----------+--------------+ IJV                      Yes       Yes                             +----------+------------+---------+-----------+----------+--------------+ Subclavian    Full       Yes       Yes                              +----------+------------+---------+-----------+----------+--------------+ Axillary      Full       Yes       Yes                             +----------+------------+---------+-----------+----------+--------------+ Brachial      Full       Yes       Yes                             +----------+------------+---------+-----------+----------+--------------+ Radial        Full                                                 +----------+------------+---------+-----------+----------+--------------+ Ulnar                                               Not visualized +----------+------------+---------+-----------+----------+--------------+ Cephalic      Full                                                 +----------+------------+---------+-----------+----------+--------------+ Basilic       Full                                                 +----------+------------+---------+-----------+----------+--------------+  Summary:  Right: No evidence of thrombosis in the subclavian.  Left: No evidence of deep vein thrombosis in the upper extremity. No evidence of superficial vein thrombosis in the upper extremity. Interstitial edema noted throughout.  *See table(s) above for measurements and observations.  Diagnosing physician: Coral Else MD Electronically signed by Coral Else MD on 03/20/2019 at 1:10:57 AM.    Final     ROS: As above Blood pressure (!) 149/87, pulse 75, temperature 37.6 C, temperature source Oral, resp. rate 15, height 6' (1.829 m), weight 93.3 kg, SpO2 100 %. Estimated body mass index is 27.9 kg/m as calculated from the following:   Height as of this encounter: 6' (1.829 m).   Weight as of this encounter: 93.3 kg.  Physical Exam  General: An alert and pleasant obese 48 year old white male in no apparent distress.  HEENT: The patient has some right periorbital swelling and ecchymosis.  He has right scleral  hemorrhaging.  Extraocular muscles are  intact.  Neck: Supple without masses or deformities.  He has a fairly normal range of motion.  Spurling's testing is negative bilaterally.  Lhermitte sign was not present.  Thorax: Symmetric  Abdomen: Obese  Extremities: Unremarkable  Neurologic exam: The patient is alert and oriented x3.  Cranial nerves II through XII were examined bilaterally and grossly normal.  Vision and hearing are grossly normal bilaterally.  The patient strength is normal in his bilateral gastrocnemius and dorsiflexors and in his right deltoid, bicep and handgrip.  He has slight weakness in his right tricep at 4/5.  He has 0/5 left deltoid, bicep, tricep and hand grip strength.  He has decreased sensation in his left upper extremity.  His deep tendon reflexes are hyperreflexic in his bilateral quadricep and gastrocnemius.  He has nonsustained bilateral ankle clonus.  Imaging studies I have reviewed the patient's cervical MRI performed yesterday.  He has spondylosis and stenosis at C5-6 and C6-7 which is moderate.  Assessment/Plan: Cervical spondylosis, cervical stenosis, cervical myelopathy, central cord syndrome: It is not entirely clear to me what is causing the patient's left upper extremity weakness.  His MRI demonstrates some muscular tears, he has a history of rhabdomyolysis, depression, alcohol abuse, suicide attempt, etc.  More commonly a central cord syndrome is bilateral, and he has weakness mainly on the left.  Given his hyperreflexia I suspect his weakness may be coming from his neck.  He does not have any severe narrowing which requires urgent decompression.  I would suggest PT, OT and rehab and allow him to recover from this injury.  We can address his cervical spondylosis and stenosis after he is recovered from this injury.  Ophelia Charter 03/20/2019, 9:37 AM

## 2019-03-20 NOTE — Progress Notes (Signed)
Pt stated he could move his left arm and feels better than before. Pt was able to left left arm just a little and was able to wiggle his fingers more and strongly that last night. He stated tingling on left arm is geting better. Swelling going down. Could feel me touching on left anterior forearm. Radial pulse palpable. Will continue to monitor.

## 2019-03-21 ENCOUNTER — Inpatient Hospital Stay (HOSPITAL_COMMUNITY): Payer: Self-pay

## 2019-03-21 LAB — BASIC METABOLIC PANEL
Anion gap: 5 (ref 5–15)
Anion gap: 6 (ref 5–15)
Anion gap: 8 (ref 5–15)
BUN: 6 mg/dL (ref 6–20)
BUN: 6 mg/dL (ref 6–20)
BUN: 7 mg/dL (ref 6–20)
CO2: 23 mmol/L (ref 22–32)
CO2: 25 mmol/L (ref 22–32)
CO2: 25 mmol/L (ref 22–32)
Calcium: 7.2 mg/dL — ABNORMAL LOW (ref 8.9–10.3)
Calcium: 7.3 mg/dL — ABNORMAL LOW (ref 8.9–10.3)
Calcium: 7.4 mg/dL — ABNORMAL LOW (ref 8.9–10.3)
Chloride: 104 mmol/L (ref 98–111)
Chloride: 106 mmol/L (ref 98–111)
Chloride: 98 mmol/L (ref 98–111)
Creatinine, Ser: 0.53 mg/dL — ABNORMAL LOW (ref 0.61–1.24)
Creatinine, Ser: 0.72 mg/dL (ref 0.61–1.24)
Creatinine, Ser: 0.74 mg/dL (ref 0.61–1.24)
GFR calc Af Amer: >60 mL/min (ref >60)
GFR calc Af Amer: >60 mL/min (ref >60)
GFR calc Af Amer: >60 mL/min (ref >60)
GFR calc non Af Amer: >60 mL/min (ref >60)
GFR calc non Af Amer: >60 mL/min (ref >60)
GFR calc non Af Amer: >60 mL/min (ref >60)
Glucose, Bld: 105 mg/dL — ABNORMAL HIGH (ref 70–99)
Glucose, Bld: 127 mg/dL — ABNORMAL HIGH (ref 70–99)
Glucose, Bld: 96 mg/dL (ref 70–99)
Potassium: 2.9 mmol/L — ABNORMAL LOW (ref 3.5–5.1)
Potassium: 3.2 mmol/L — ABNORMAL LOW (ref 3.5–5.1)
Potassium: 4.1 mmol/L (ref 3.5–5.1)
Sodium: 131 mmol/L — ABNORMAL LOW (ref 135–145)
Sodium: 134 mmol/L — ABNORMAL LOW (ref 135–145)
Sodium: 135 mmol/L (ref 135–145)

## 2019-03-21 LAB — CBC
HCT: 32.8 % — ABNORMAL LOW (ref 39.0–52.0)
Hemoglobin: 11.8 g/dL — ABNORMAL LOW (ref 13.0–17.0)
MCH: 38.7 pg — ABNORMAL HIGH (ref 26.0–34.0)
MCHC: 36 g/dL (ref 30.0–36.0)
MCV: 107.5 fL — ABNORMAL HIGH (ref 80.0–100.0)
Platelets: 111 10*3/uL — ABNORMAL LOW (ref 150–400)
RBC: 3.05 MIL/uL — ABNORMAL LOW (ref 4.22–5.81)
RDW: 11.6 % (ref 11.5–15.5)
WBC: 4.6 10*3/uL (ref 4.0–10.5)
nRBC: 0 % (ref 0.0–0.2)

## 2019-03-21 LAB — HEPATIC FUNCTION PANEL
ALT: 108 U/L — ABNORMAL HIGH (ref 0–44)
AST: 291 U/L — ABNORMAL HIGH (ref 15–41)
Albumin: 1.9 g/dL — ABNORMAL LOW (ref 3.5–5.0)
Alkaline Phosphatase: 27 U/L — ABNORMAL LOW (ref 38–126)
Bilirubin, Direct: 0.4 mg/dL — ABNORMAL HIGH (ref 0.0–0.2)
Indirect Bilirubin: 0.7 mg/dL (ref 0.3–0.9)
Total Bilirubin: 1.1 mg/dL (ref 0.3–1.2)
Total Protein: 4.1 g/dL — ABNORMAL LOW (ref 6.5–8.1)

## 2019-03-21 LAB — GLUCOSE, CAPILLARY: Glucose-Capillary: 91 mg/dL (ref 70–99)

## 2019-03-21 LAB — CK: Total CK: 3162 U/L — ABNORMAL HIGH (ref 49–397)

## 2019-03-21 MED ORDER — GADOBUTROL 1 MMOL/ML IV SOLN
9.0000 mL | Freq: Once | INTRAVENOUS | Status: AC | PRN
  Administered 2019-03-21: 9 mL via INTRAVENOUS

## 2019-03-21 MED ORDER — POLYVINYL ALCOHOL 1.4 % OP SOLN
2.0000 [drp] | OPHTHALMIC | Status: DC | PRN
  Filled 2019-03-21: qty 15

## 2019-03-21 MED ORDER — AMLODIPINE BESYLATE 10 MG PO TABS
10.0000 mg | ORAL_TABLET | Freq: Every day | ORAL | Status: DC
  Administered 2019-03-21 – 2019-03-22 (×2): 10 mg via ORAL
  Filled 2019-03-21 (×2): qty 1

## 2019-03-21 NOTE — Progress Notes (Signed)
Physical Therapy Treatment Patient Details Name: Vanessa RalphsBenjamin Griesinger MRN: [id number] DOB: 07/05/1971 Today's Date: 03/21/2019    History of Present Illness Patient is a 48 year old male with a history of HTN, Alcohol use disorder, Major depression, and hepatic steatosis who presented with EMS after being found down at his home four days after he overdosed on Lorazepam, trazodone and alcohol in a suicide attempt.    PT Comments    Patient progressing greatly since last session.  Able to sit up to EOB with min A and able to stand x 2, but limited due to HR up to 176 and tremulous in LE's.  L UE improved as well as able to move rather well in gravity eliminated positions.  Feel continued skilled PT in acute setting and follow up PT at d/c still appropriate.    Follow Up Recommendations  Other (comment)(PT follow up at inpatient psychiatric facility)     Equipment Recommendations  Other (comment)(TBA)    Recommendations for Other Services       Precautions / Restrictions Precautions Precautions: Fall Precaution Comments: Watch HR, L arm peripheral nerve injury    Mobility  Bed Mobility Overal bed mobility: Needs Assistance Bed Mobility: Supine to Sit Rolling: Min assist         General bed mobility comments: moving legs off bed and pulled up with R arm to sit  Transfers Overall transfer level: Needs assistance Equipment used: Rolling walker (2 wheeled) Transfers: Sit to/from [employer] CorporationStand;Stand Pivot Transfers Sit to Stand: Mod assist;+2 physical assistance;From elevated surface Stand pivot transfers: Mod assist;Max assist;+2 physical assistance       General transfer comment: Stood up to RW with +2 for lifting, LE's fatigued quickly with HR up to 176 so returned to sit EOB; HR back down to 150 so stood briefly with bilat UE support (no RW) to get to chair and sat on corner of chair, then rested as HR back up then scooted for positioning in chair  Ambulation/Gait                  Stairs             Wheelchair Mobility    Modified Rankin (Stroke Patients Only)       Balance Overall balance assessment: Needs assistance Sitting-balance support: Feet supported Sitting balance-Leahy Scale: Fair     Standing balance support: Bilateral upper extremity supported Standing balance-Leahy Scale: Poor Standing balance comment: mod A of 2                            Cognition Arousal/Alertness: Awake/alert Behavior During Therapy: Anxious Overall Cognitive Status: Impaired/Different from baseline Area of Impairment: Safety/judgement                         Safety/Judgement: Decreased awareness of deficits     General Comments: slightly slow to respond      Exercises General Exercises - Upper Extremity Shoulder Flexion: 10 reps;Left;AAROM;Seated Elbow Flexion: AAROM;10 reps;Seated;Left Elbow Extension: AAROM;Left;10 reps;Seated Wrist Extension: AROM;5 reps;Left;Seated General Exercises - Lower Extremity Long Arc Quad: AROM;10 reps;Seated;Both Hip Flexion/Marching: AROM;10 reps;Seated;Both    General Comments        Pertinent Vitals/Pain Pain Assessment: Faces Faces Pain Scale: Hurts little more Pain Location: tingling L arm Pain Descriptors / Indicators: Tingling Pain Intervention(s): Monitored during session;Repositioned    Home Living  Prior Function            PT Goals (current goals can now be found in the care plan section) Progress towards PT goals: Progressing toward goals    Frequency    Min 3X/week      PT Plan Current plan remains appropriate    Co-evaluation              AM-PAC PT "6 Clicks" Mobility   Outcome Measure  Help needed turning from your back to your side while in a flat bed without using bedrails?: A Little Help needed moving from lying on your back to sitting on the side of a flat bed without using bedrails?: A Little Help needed moving  to and from a bed to a chair (including a wheelchair)?: Total Help needed standing up from a chair using your arms (e.g., wheelchair or bedside chair)?: Total Help needed to walk in hospital room?: Total Help needed climbing 3-5 steps with a railing? : Total 6 Click Score: 10    End of Session Equipment Utilized During Treatment: Gait belt Activity Tolerance: Treatment limited secondary to medical complications (Comment)(HR up to 176) Patient left: in chair;with call bell/phone within reach;with nursing/sitter in room Nurse Communication: Mobility status;Need for lift equipment PT Visit Diagnosis: Other abnormalities of gait and mobility (R26.89);Muscle weakness (generalized) (M62.81)     Time: 3343-5686 PT Time Calculation (min) (ACUTE ONLY): 23 min  Charges:  $Therapeutic Exercise: 8-22 mins $Therapeutic Activity: 8-22 mins                     Magda Kiel, [place] [phone] 03/21/2019    Reginia Naas 03/21/2019, 4:42 PM

## 2019-03-21 NOTE — Progress Notes (Signed)
Subjective: Pt seen at the bedside this AM.  Patient does not have any pain this morning.  Weakness in his left upper extremity has improved minorly, noting he can wiggle his fingers.  No other complaints today.  Objective:  Vital signs in last 24 hours: Vitals:   03/20/19 1726 03/20/19 2017 03/20/19 2338 03/21/19 0403  BP: 139/86 139/90 (!) 151/91 (!) 159/89  Pulse:  88 77 81  Resp:  15 19 18   Temp:  98.8 F (37.1 C) 98.6 F (37 C) 98.6 F (37 C)  TempSrc:  Oral Oral Oral  SpO2:  100% 99% 100%  Weight:      Height:       Physical Exam: General: NAD, resting in bed HEENT: R eye bruising CV: RRR, normal S1 and S2.  No murmurs, rubs or gallops appreciated PULM: Clear to auscultation bilaterally, no crackles appreciated ABD: Superficial abrasions in the upper quadrants covered by bandages. NEURO: Alert and oriented, no focal deficits  Assessment/Plan:  Principal Problem:   Major depressive disorder, recurrent episode, severe (HCC) Active Problems:   Rhabdomyolysis   Suicide attempt (HCC)   Thrombocytopenia (HCC)   AKI (acute kidney injury) (HCC)   Hematoma of eyelid   Alcohol use disorder, severe, dependence (HCC)   Subconjunctival hemorrhage, traumatic, right  In summary, [name] is a 48 year old male with a history of HTN, polysubstance use disorder, depression and hepatic steatosis who presented to the ER after being found down at home for unknown known amount of time. He is suspected to have been down for 4 days after taking Lorazepam, trazodone and alcohol as an attempted suicide. Pt was admitted for rhabdomyolysis with CK greater than 8000, L eye injury, and thrombocytopenia of 65, which are all improving daily.    #Depression with SI #Suicide Attempt #Polysubstance Use Disorder: Psychiatry saw the patient yesterday and recommends inpatient psych to manage SI.They further advised not utilizing antidepressants or antipsychotics due to CKs below 3000 with the  exception of the CIWA. - Continue CIWA protocol    #Rhabdomyolysis: Improving, mild initially 8252, now 4338. We will continue to trend his CK, BMP and adjust fluids as indicated. Serum creatinine is returned to normal.  -Continue trendending CK levels every 12 hours -BMPs q6hrs -Decrease IVF 110825mL/hour NS from 250 cc - Post void residual volume ordered -Encourage PO intake  #LUE Swelling #LUE Weakness: Patient continues to have 0/5 strength and numbness in the LUE. LUE Doppler study negative for DVT. MRI of his cervical spine which was notable for degenerative spinal stenosis and by for minimal impingement from C3-4, C6-7 and flattening of the spinal cord at C5-7.Neurology & Neurosurgery as seen the patient and reccomendations are below. - PT/OT evaluation: recommend follow-up at psychiatric hosptial - Address cervical spondylosis and stenosis after he is recovered from this current injury.  - May consider L shoulder brachial plexus MRI and/or EMG studies as an outpatient   #Hx of Keratoconus #R Eye Traumatic Injury: Seen by Dr. Alben SpittleWeaver with ophthalmology. Will appreciate recommendations. - Erythromycin ointment x5 days (day 3/5) - Artificial tears PRN  #Transaminitis #Hepatic steatosis:Patient statesthat he had hepatitis as achild.Was worked up in 2017 by GI fortransaminitis, but was negative for viral hepatitis.Patient had significantly elevated ferritin levels but was negative for hemochromatosis. His anti-smooth muscle antibody titer was 1:28 and GI wanted to do a liver biopsy but was lost to follow-up. Patient's initial labsincluding AST:ALT - 207:52, elevated INR,and CT findings of hepatic steatosis are suggestive toalcohol-related hepatic  injury. AST:ALT now 147:829.  -Consider GI f/u in the outpatient setting  #Thrombocytopenia:Chronic liver dz vs. Alcohol induced. 65 on admission. Improved to 111 today. - Monitor with daily CBC  #FEN/GI - Regular diet -  Continue IV NS 125 cc/hr   #DVT prophylaxis:Enoxaparin - Lovenox subq injections   #Code: Full  #Dispo: Pending medical course. Will need transfer to inpatient psych for ongoing management of SI.    Earlene Plater, MD Internal Medicine, PGY1 Pager: [phone]  03/21/2019,11:19 AM

## 2019-03-22 DIAGNOSIS — F10129 Alcohol abuse with intoxication, unspecified: Secondary | ICD-10-CM

## 2019-03-22 LAB — CBC
HCT: 34 % — ABNORMAL LOW (ref 39.0–52.0)
Hemoglobin: 12.4 g/dL — ABNORMAL LOW (ref 13.0–17.0)
MCH: 38.8 pg — ABNORMAL HIGH (ref 26.0–34.0)
MCHC: 36.5 g/dL — ABNORMAL HIGH (ref 30.0–36.0)
MCV: 106.3 fL — ABNORMAL HIGH (ref 80.0–100.0)
Platelets: 158 10*3/uL (ref 150–400)
RBC: 3.2 MIL/uL — ABNORMAL LOW (ref 4.22–5.81)
RDW: 11.6 % (ref 11.5–15.5)
WBC: 5.4 10*3/uL (ref 4.0–10.5)
nRBC: 0 % (ref 0.0–0.2)

## 2019-03-22 LAB — CULTURE, BLOOD (ROUTINE X 2)
Culture: NO GROWTH
Culture: NO GROWTH
Special Requests: ADEQUATE

## 2019-03-22 LAB — BASIC METABOLIC PANEL
Anion gap: 10 (ref 5–15)
Anion gap: 9 (ref 5–15)
BUN: 6 mg/dL (ref 6–20)
BUN: 6 mg/dL (ref 6–20)
CO2: 25 mmol/L (ref 22–32)
CO2: 25 mmol/L (ref 22–32)
Calcium: 7.5 mg/dL — ABNORMAL LOW (ref 8.9–10.3)
Calcium: 7.6 mg/dL — ABNORMAL LOW (ref 8.9–10.3)
Chloride: 99 mmol/L (ref 98–111)
Chloride: 101 mmol/L (ref 98–111)
Creatinine, Ser: 0.66 mg/dL (ref 0.61–1.24)
Creatinine, Ser: 0.63 mg/dL (ref 0.61–1.24)
GFR calc Af Amer: >60 mL/min (ref >60)
GFR calc Af Amer: >60 mL/min (ref >60)
GFR calc non Af Amer: >60 mL/min (ref >60)
GFR calc non Af Amer: >60 mL/min (ref >60)
Glucose, Bld: 92 mg/dL (ref 70–99)
Glucose, Bld: 98 mg/dL (ref 70–99)
Potassium: 3.2 mmol/L — ABNORMAL LOW (ref 3.5–5.1)
Potassium: 3.5 mmol/L (ref 3.5–5.1)
Sodium: 134 mmol/L — ABNORMAL LOW (ref 135–145)
Sodium: 135 mmol/L (ref 135–145)

## 2019-03-22 LAB — GLUCOSE, CAPILLARY: Glucose-Capillary: 88 mg/dL (ref 70–99)

## 2019-03-22 LAB — CK
Total CK: 2140 U/L — ABNORMAL HIGH (ref 49–397)
Total CK: 1828 U/L — ABNORMAL HIGH (ref 49–397)

## 2019-03-22 MED ORDER — POTASSIUM CHLORIDE CRYS ER 20 MEQ PO TBCR
40.0000 meq | EXTENDED_RELEASE_TABLET | Freq: Once | ORAL | Status: DC
  Filled 2019-03-22: qty 2

## 2019-03-22 MED ORDER — POTASSIUM CHLORIDE CRYS ER 20 MEQ PO TBCR
40.0000 meq | EXTENDED_RELEASE_TABLET | Freq: Two times a day (BID) | ORAL | Status: AC
  Administered 2019-03-22 (×2): 40 meq via ORAL
  Filled 2019-03-22: qty 2

## 2019-03-22 MED ORDER — POTASSIUM CHLORIDE CRYS ER 20 MEQ PO TBCR
40.0000 meq | EXTENDED_RELEASE_TABLET | ORAL | Status: AC
  Administered 2019-03-22 (×2): 40 meq via ORAL
  Filled 2019-03-22 (×2): qty 2

## 2019-03-22 MED ORDER — SERTRALINE HCL 25 MG PO TABS
25.0000 mg | ORAL_TABLET | Freq: Every day | ORAL | Status: DC
  Administered 2019-03-22: 25 mg via ORAL
  Filled 2019-03-22: qty 1

## 2019-03-22 MED ORDER — ESCITALOPRAM OXALATE 10 MG PO TABS
10.0000 mg | ORAL_TABLET | Freq: Every day | ORAL | Status: DC
  Administered 2019-03-23 – 2019-03-31 (×9): 10 mg via ORAL
  Filled 2019-03-22 (×9): qty 1

## 2019-03-22 MED ORDER — ESCITALOPRAM OXALATE 10 MG PO TABS: 10.0000 mg | ORAL_TABLET | Freq: Every day | ORAL | Status: DC

## 2019-03-22 NOTE — Progress Notes (Signed)
Occupational Therapy Treatment Patient Details Name: Clayton Baldwin MRN: [id number] DOB: 25-Oct-1970 Today's Date: 03/22/2019    History of present illness Patient is a 48 year old male with a history of HTN, Alcohol use disorder, Major depression, and hepatic steatosis who presented with EMS after being found down at his home four days after he overdosed on Lorazepam, trazodone and alcohol in a suicide attempt.   OT comments  Upon entering the room, pt supine in bed with NT present in room. Pt is agreeable to OT intervention. Pt verbalized feeling very sleepy as he had a test performed late last night. Pt with more successful movement in gravity eliminated position in all planes of movement. Focus on AROM/AAROM x 5 reps each as well. OT educating pt on self ROM of L UE and locating L UE for safety with mobility. Pt was receptive to information and engaged in a meaningful way. Pt continues to benefit from acute OT intervention.   Follow Up Recommendations  Supervision/Assistance - 24 hour ( follow up OT at psychiatric hospital)   Equipment Recommendations  None recommended by OT    Recommendations for Other Services      Precautions / Restrictions Precautions Precautions: Fall Precaution Comments: Watch HR, L arm peripheral nerve injury Restrictions Weight Bearing Restrictions: No              ADL either performed or assessed with clinical judgement                  Cognition Arousal/Alertness: Awake/alert Behavior During Therapy: Anxious Overall Cognitive Status: Impaired/Different from baseline Area of Impairment: Safety/judgement     Following Commands: Follows one step commands consistently Safety/Judgement: Decreased awareness of deficits   Problem Solving: Slow processing;Decreased initiation;Difficulty sequencing;Requires tactile cues;Requires verbal cues General Comments: slightly slow to respond                   Pertinent Vitals/ Pain       Pain  Assessment: Faces Faces Pain Scale: No hurt         Frequency  Min 2X/week        Progress Toward Goals  OT Goals(current goals can now be found in the care plan section)  Progress towards OT goals: Progressing toward goals  Acute Rehab OT Goals Patient Stated Goal: to move arm better  Plan Discharge plan remains appropriate       AM-PAC OT "6 Clicks" Daily Activity     Outcome Measure   Help from another person eating meals?: A Little Help from another person taking care of personal grooming?: A Little Help from another person toileting, which includes using toliet, bedpan, or urinal?: A Lot Help from another person bathing (including washing, rinsing, drying)?: A Lot Help from another person to put on and taking off regular upper body clothing?: A Lot Help from another person to put on and taking off regular lower body clothing?: A Lot 6 Click Score: 14    End of Session    OT Visit Diagnosis: Muscle weakness (generalized) (M62.81);History of falling (Z91.81);Unsteadiness on feet (R26.81)   Activity Tolerance Patient tolerated treatment well   Patient Left in bed;with call bell/phone within reach;with bed alarm set;with nursing/sitter in room   Nurse Communication          Time: 7628-3151 OT Time Calculation (min): 25 min  Charges: OT General Charges $OT Visit: 1 Visit OT Treatments $Therapeutic Exercise: 23-37 mins   Domonik Levario P, MS, OTR/L 03/22/2019, 2:46 PM

## 2019-03-22 NOTE — Progress Notes (Signed)
Subjective: Patient seen at the bedside this morning on rounds.  Patient says he feels "pretty good".  His eye is not painful.  He says the weakness in his left hand and arm seems to fluctuate.  He says at night he is able to move his fingers more.  Says he thinks he could move a little bit more if his arm point is still swollen.  No other complaints at this time.  Objective:  Vital signs in last 24 hours: Vitals:   03/21/19 1953 03/21/19 2314 03/22/19 0352 03/22/19 0738  BP: (!) 151/104 (!) 134/96 (!) 150/105 (!) 140/96  Pulse: (!) 114 (!) 109 88 91  Resp: 15 17 16 15   Temp: 99.6 F (37.6 C) 99.3 F (37.4 C) 98.7 F (37.1 C) 99.1 F (37.3 C)  TempSrc: Oral Oral Oral Oral  SpO2: 99% 99% 99% 99%  Weight:   92.1 kg   Height:       Physical Exam: General: Resting in bed, NAD HEENT: Bruising over the left eye CV: RRR, normal S1 and S2, no murmurs rubs or gallops appreciated PULM: Clear to auscultation bilaterally MSK: 2/5 LUE strength, improved from previous day.  Patient unable to make a fist with left hand. NEURO: Alert and oriented, sensation grossly intact  Assessment/Plan:  Principal Problem:   Major depressive disorder, recurrent episode, severe (HCC) Active Problems:   Rhabdomyolysis   Suicide attempt (HCC)   Thrombocytopenia (HCC)   AKI (acute kidney injury) (HCC)   Hematoma of eyelid   Alcohol use disorder, severe, dependence (HCC)   Subconjunctival hemorrhage, traumatic, right  In summary, Mr. Illene LabradorMorrell isa 48 year old male with a history of HTN, polysubstance use disorder, depression and hepatic steatosis who presented to the ER after being found down at home for unknown known amount of time. He is suspected to have been down for 4 days after taking Lorazepam, trazodone and alcohol as anattempted suicide. Pt was admitted forrhabdomyolysis with CK greater than 8000, L eye injury,andthrombocytopenia of65, which have all improved with daily labs. At this point in  time, the patient is medically stable for discharge.  #Depression with SI #Suicide Attempt #PolysubstanceUseDisorder:Psychiatry saw the patient yesterday and recommends inpatient psych to manage SI.They further advised not utilizing antidepressants or antipsychotics due to CKs below 3000 with the exception of the CIWA. -ContinueCIWA protocol  #Rhabdomyolysis:CK continues to improve. On admission CK was 8252, now 1828.Will obtain labs less frequently moving forward. -Continue trendendingCK levels every 12 hours -BMPsq6hrs -Decrease IVF 18025mL/hour NS from 250 cc - Post void residual volume ordered -EncouragePO intake  #LUE Swelling #LUE Weakness:Patient has mildly improved 2/5 strength in the LUE.  Was 0/5 on previous days. LUE Doppler study negative for DVT. MRI of his cervical spine which was notable for degenerative spinal stenosis and by for minimal impingement from C3-4, C6-7 and flattening of the spinal cord at C5-7.Neurology & Neurosurgery as seen the patient and reccomendations are below. -PT/OTevaluation: recommend follow-up at psychiatric hosptial -Address cervical spondylosis and stenosis after he is recovered from this current injury.  - May consider L shoulder brachial plexus MRI and/or EMG studies as an outpatient   #Hx of Keratoconus #R Eye Traumatic Injury:Improving.  Seen by Dr. Alben SpittleWeaver with ophthalmology.Will appreciate recommendations. - Erythromycin ointment x5 days (day 3/5) - Artificial tears PRN  #Transaminitis #Hepatic steatosis:Patient statesthat he had hepatitis as achild.Was worked up in 2017 by GI fortransaminitis, but was negative for viral hepatitis.Patient had significantly elevated ferritin levels but was negative for hemochromatosis.  His anti-smooth muscle antibody titer was 1:28 and GI wanted to do a liver biopsy but was lost to follow-up. Patient'sinitiallabsincluding AST:ALT - 207:52, elevated INR,and CT findings of  hepatic steatosis are suggestive toalcohol-related hepatic injury. Most recent AST:ALT now 291:108.  - Consider GI f/u in the outpatient setting - Daily CMP  #Thrombocytopenia:Improving.  Chronic liver dz vs. Alcohol induced. 65 on admission.Improved to 158 today. - Daily CBC  #FEN/GI - Regulardiet - D/c NS 125 cc/hr today.  Encourage p.o. intake  #Code: Full  #Dispo:The patient is currently medically stable for discharge. He will need transfer to inpatient psych for ongoing management of SI.  Earlene Plater, MD Internal Medicine, PGY1 Pager: [phone]  03/22/2019,1:52 PM

## 2019-03-23 LAB — CK: Total CK: 1498 U/L — ABNORMAL HIGH (ref 49–397)

## 2019-03-23 LAB — CBC
HCT: 34.7 % — ABNORMAL LOW (ref 39.0–52.0)
Hemoglobin: 12.1 g/dL — ABNORMAL LOW (ref 13.0–17.0)
MCH: 37.3 pg — ABNORMAL HIGH (ref 26.0–34.0)
MCHC: 34.9 g/dL (ref 30.0–36.0)
MCV: 107.1 fL — ABNORMAL HIGH (ref 80.0–100.0)
Platelets: 209 10*3/uL (ref 150–400)
RBC: 3.24 MIL/uL — ABNORMAL LOW (ref 4.22–5.81)
RDW: 11.7 % (ref 11.5–15.5)
WBC: 6.5 10*3/uL (ref 4.0–10.5)
nRBC: 0 % (ref 0.0–0.2)

## 2019-03-23 LAB — COMPREHENSIVE METABOLIC PANEL
ALT: 119 U/L — ABNORMAL HIGH (ref 0–44)
AST: 170 U/L — ABNORMAL HIGH (ref 15–41)
Albumin: 2.2 g/dL — ABNORMAL LOW (ref 3.5–5.0)
Alkaline Phosphatase: 30 U/L — ABNORMAL LOW (ref 38–126)
Anion gap: 10 (ref 5–15)
BUN: 6 mg/dL (ref 6–20)
CO2: 24 mmol/L (ref 22–32)
Calcium: 8 mg/dL — ABNORMAL LOW (ref 8.9–10.3)
Chloride: 98 mmol/L (ref 98–111)
Creatinine, Ser: 0.64 mg/dL (ref 0.61–1.24)
GFR calc Af Amer: >60 mL/min (ref >60)
GFR calc non Af Amer: >60 mL/min (ref >60)
Glucose, Bld: 93 mg/dL (ref 70–99)
Potassium: 3.7 mmol/L (ref 3.5–5.1)
Sodium: 132 mmol/L — ABNORMAL LOW (ref 135–145)
Total Bilirubin: 1.5 mg/dL — ABNORMAL HIGH (ref 0.3–1.2)
Total Protein: 4.6 g/dL — ABNORMAL LOW (ref 6.5–8.1)

## 2019-03-23 LAB — GLUCOSE, CAPILLARY: Glucose-Capillary: 128 mg/dL — ABNORMAL HIGH (ref 70–99)

## 2019-03-23 LAB — METHYLMALONIC ACID, SERUM: Methylmalonic Acid, Quantitative: 441 nmol/L — ABNORMAL HIGH (ref 0–378)

## 2019-03-23 MED ORDER — AMLODIPINE BESYLATE 10 MG PO TABS
10.0000 mg | ORAL_TABLET | Freq: Every day | ORAL | Status: DC
  Administered 2019-03-23 – 2019-03-30 (×7): 10 mg via ORAL
  Filled 2019-03-23 (×7): qty 1

## 2019-03-23 MED ORDER — LISINOPRIL 5 MG PO TABS
5.0000 mg | ORAL_TABLET | Freq: Every day | ORAL | Status: DC
  Administered 2019-03-23 – 2019-03-30 (×7): 5 mg via ORAL
  Filled 2019-03-23 (×7): qty 1
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
## 2019-03-23 NOTE — Plan of Care (Signed)
  Problem: Education: Goal: Knowledge of General Education information will improve Description Including pain rating scale, medication(s)/side effects and non-pharmacologic comfort measures Outcome: Progressing   Problem: Health Behavior/Discharge Planning: Goal: Ability to manage health-related needs will improve Outcome: Progressing   

## 2019-03-23 NOTE — Progress Notes (Deleted)
Called IM reference to pt needing pysch consult per order BY Phone according to guidelines. Dr Christel Mormon to make phone call stating that the order was enough. Advised Dr that per order it stated  "At [facility] and [facility], [facility] consult orders are only for documentation-tracking purposes; a phone call MUST be made. "  Dr still stated he was not calling. Advised Asst Director of 4E, she advised she would call pysch. As of 11:58 still no word from pysch. Jerald Kief, RN

## 2019-03-23 NOTE — Consult Note (Signed)
Telepsych Consultation   Reason for Consult: ''Patient saying he no longer has SI. Please evaluate to see if he is still a candiate for inpatient psych.'' Referring Physician:  Dr. Erlinda Honguncan Vincent  Location of Patient: [facility] Location of Provider: [facility]  Patient Identification: Clayton Baldwin MRN:  [id number] Principal Diagnosis: Severe episode of recurrent major depressive disorder, without psychotic features (HCC) Diagnosis:  Principal Problem:   Major depressive disorder, recurrent episode, severe (HCC) Active Problems:   Rhabdomyolysis   Suicide attempt (HCC)   Thrombocytopenia (HCC)   AKI (acute kidney injury) (HCC)   Hematoma of eyelid   Alcohol use disorder, severe, dependence (HCC)   Subconjunctival hemorrhage, traumatic, right   Total Time spent with patient: 15 minutes  Subjective:   Clayton RalphsBenjamin Paganelli is a 48 y.o. male patient admitted with rhabdomyolysis secondary to a suicide attempt by drug overdose.  HPI:   Per chart review, patient was admitted with on 9/4 with rhabdomyolysis secondary to a suicide attempt by drug overdose. He was last seen by the psychiatry consult service on 9/5 for suicide attempt by overdose. He was recommended for inpatient psychiatric hospitalization. He was found down at his home four days after he overdosed on Ativan and Trazodone in the setting of alcohol use. He has been depressed since he lost his job a few years ago. Lexapro 10 mg daily was started today.   On interview, Clayton Baldwin reports that he is "fine" although his affect appears flat. He reports financial stressors and is living off his savings. He has been unemployed since leaving his job at [employer]. He lives alone and reports limited social support. He denies suicidal thoughts since admission. He denies a history of suicide attempts. He denies HI or AVH. He reports poor sleep since hospitalization. He denies problems with appetite.    Past  Psychiatric History: MDD and alcohol use disorder  Risk to Self:  Yes given serious suicide attempt and ongoing depression.  Risk to Others:  None. Denies HI.  Prior Inpatient Therapy:   Denies  Prior Outpatient Therapy: He is followed by his PCP.   Past Medical History:  Past Medical History:  Diagnosis Date  . Depression   . Hypertension   . Insomnia     Past Surgical History:  Procedure Laterality Date  . APPENDECTOMY     Family History:  Family History  Problem Relation Age of Onset  . Liver disease Other   . Colon cancer Neg Hx   . Colon polyps Neg Hx    Family Psychiatric  History: Denies  Social History:  Social History   Substance and Sexual Activity  Alcohol Use Yes  . Alcohol/week: 0.0 standard drinks   Comment: couple of beers about 4 times per week      Social History   Substance and Sexual Activity  Drug Use No    Social History   Socioeconomic History  . Marital status: Single    Spouse name: Not on file  . Number of children: Not on file  . Years of education: Not on file  . Highest education level: Not on file  Occupational History  . Occupation: unemployed  Social Needs  . Financial resource strain: Not on file  . Food insecurity    Worry: Not on file    Inability: Not on file  . Transportation needs    Medical: Not on file    Non-medical: Not on file  Tobacco Use  . Smoking status: Never  Smoker  . Smokeless tobacco: Never Used  . Tobacco comment: Never really smoked  Substance and Sexual Activity  . Alcohol use: Yes    Alcohol/week: 0.0 standard drinks    Comment: couple of beers about 4 times per week   . Drug use: No  . Sexual activity: Not on file  Lifestyle  . Physical activity    Days per week: Not on file    Minutes per session: Not on file  . Stress: Not on file  Relationships  . Social Musician on phone: Not on file    Gets together: Not on file    Attends religious service: Not on file    Active member  of club or organization: Not on file    Attends meetings of clubs or organizations: Not on file    Relationship status: Not on file  Other Topics Concern  . Not on file  Social History Narrative  . Not on file   Additional Social History: He lives alone. He reports drinking 2-3 beers daily.     Allergies:   Allergies  Allergen Reactions  . Grass Extracts [Gramineae Pollens] Itching and Other (See Comments)    Wheezing, also    Labs:  Results for orders placed or performed during the hospital encounter of 03/17/19 (from the past 48 hour(s))  Basic metabolic panel     Status: Abnormal   Collection Time: 03/21/19 11:17 PM  Result Value Ref Range   Sodium 131 (L) 135 - 145 mmol/L   Potassium 2.9 (L) 3.5 - 5.1 mmol/L   Chloride 98 98 - 111 mmol/L   CO2 25 22 - 32 mmol/L   Glucose, Bld 127 (H) 70 - 99 mg/dL   BUN 6 6 - 20 mg/dL   Creatinine, Ser 1.61 0.61 - 1.24 mg/dL   Calcium 7.4 (L) 8.9 - 10.3 mg/dL   GFR calc non Af Amer >60 >60 mL/min   GFR calc Af Amer >60 >60 mL/min   Anion gap 8 5 - 15    Comment: Performed at [facility], [address] [place], [place] [address]  CK     Status: Abnormal   Collection Time: 03/21/19 11:17 PM  Result Value Ref Range   Total CK 2,140 (H) 49 - 397 U/L    Comment: RESULTS CONFIRMED BY MANUAL DILUTION Performed at [facility], [address], [place], [place] [address]   Glucose, capillary     Status: None   Collection Time: 03/22/19  6:51 AM  Result Value Ref Range   Glucose-Capillary 88 70 - 99 mg/dL  CBC     Status: Abnormal   Collection Time: 03/22/19  7:15 AM  Result Value Ref Range   WBC 5.4 4.0 - 10.5 K/uL   RBC 3.20 (L) 4.22 - 5.81 MIL/uL   Hemoglobin 12.4 (L) 13.0 - 17.0 g/dL   HCT 11.9 (L) 14.7 - 82.9 %   MCV 106.3 (H) 80.0 - 100.0 fL   MCH 38.8 (H) 26.0 - 34.0 pg   MCHC 36.5 (H) 30.0 - 36.0 g/dL   RDW 56.2 13.0 - 86.5 %   Platelets 158 150 - 400 K/uL   nRBC 0.0 0.0 - 0.2 %    Comment: Performed at  [facility], [address] [place], [place] [address]  Basic metabolic panel     Status: Abnormal   Collection Time: 03/22/19  7:15 AM  Result Value Ref Range  Sodium 134 (L) 135 - 145 mmol/L   Potassium 3.2 (L) 3.5 - 5.1 mmol/L   Chloride 99 98 - 111 mmol/L   CO2 25 22 - 32 mmol/L   Glucose, Bld 92 70 - 99 mg/dL   BUN 6 6 - 20 mg/dL   Creatinine, Ser 9.48 0.61 - 1.24 mg/dL   Calcium 7.5 (L) 8.9 - 10.3 mg/dL   GFR calc non Af Amer >60 >60 mL/min   GFR calc Af Amer >60 >60 mL/min   Anion gap 10 5 - 15    Comment: Performed at [facility], [address], [place], [place] [address]  CK     Status: Abnormal   Collection Time: 03/22/19  7:15 AM  Result Value Ref Range   Total CK 1,828 (H) 49 - 397 U/L    Comment: Performed at [facility], [address], [place], [place] [address]  Basic metabolic panel     Status: Abnormal   Collection Time: 03/22/19 11:57 AM  Result Value Ref Range   Sodium 135 135 - 145 mmol/L   Potassium 3.5 3.5 - 5.1 mmol/L   Chloride 101 98 - 111 mmol/L   CO2 25 22 - 32 mmol/L   Glucose, Bld 98 70 - 99 mg/dL   BUN 6 6 - 20 mg/dL   Creatinine, Ser 9.38 0.61 - 1.24 mg/dL   Calcium 7.6 (L) 8.9 - 10.3 mg/dL   GFR calc non Af Amer >60 >60 mL/min   GFR calc Af Amer >60 >60 mL/min   Anion gap 9 5 - 15    Comment: Performed at [facility], [address], [place], [place] [address]  CBC     Status: Abnormal   Collection Time: 03/23/19  3:07 AM  Result Value Ref Range   WBC 6.5 4.0 - 10.5 K/uL   RBC 3.24 (L) 4.22 - 5.81 MIL/uL   Hemoglobin 12.1 (L) 13.0 - 17.0 g/dL   HCT 37.1 (L) 69.6 - 78.9 %   MCV 107.1 (H) 80.0 - 100.0 fL   MCH 37.3 (H) 26.0 - 34.0 pg   MCHC 34.9 30.0 - 36.0 g/dL   RDW 38.1 01.7 - 51.0 %   Platelets 209 150 - 400 K/uL   nRBC 0.0 0.0 - 0.2 %    Comment: Performed at [facility], [address] [place], [place] [address]  Comprehensive metabolic panel     Status: Abnormal   Collection Time: 03/23/19   3:07 AM  Result Value Ref Range   Sodium 132 (L) 135 - 145 mmol/L   Potassium 3.7 3.5 - 5.1 mmol/L   Chloride 98 98 - 111 mmol/L   CO2 24 22 - 32 mmol/L   Glucose, Bld 93 70 - 99 mg/dL   BUN 6 6 - 20 mg/dL   Creatinine, Ser 7.78 0.61 - 1.24 mg/dL   Calcium 8.0 (L) 8.9 - 10.3 mg/dL   Total Protein 4.6 (L) 6.5 - 8.1 g/dL   Albumin 2.2 (L) 3.5 - 5.0 g/dL   AST 242 (H) 15 - 41 U/L   ALT 119 (H) 0 - 44 U/L   Alkaline Phosphatase 30 (L) 38 - 126 U/L   Total Bilirubin 1.5 (H) 0.3 - 1.2 mg/dL   GFR calc non Af Amer >60 >60 mL/min   GFR calc Af Amer >60 >60 mL/min   Anion gap 10 5 - 15    Comment: Performed at [facility]  [facility], [address] [place], [place] [address]  [place]     Status: Abnormal   Collection Time: 03/23/19  3:07 AM  Result Value Ref Range   Total CK 1,498 (H) 49 - 397 U/L    Comment: Performed at [facility], [address] [place], [place] [address]  Glucose, capillary     Status: Abnormal   Collection Time: 03/23/19  8:55 AM  Result Value Ref Range   Glucose-Capillary 128 (H) 70 - 99 mg/dL    Medications:  Current Facility-Administered Medications  Medication Dose Route Frequency Provider Last Rate Last Dose  . acetaminophen (TYLENOL) tablet 650 mg  650 mg Oral Q6H PRN Seawell, Jaimie A, DO   650 mg at 03/23/19 0412  . amLODipine (NORVASC) tablet 10 mg  10 mg Oral Daily Kirt BoysAlexander, Andrew, MD      . calcium carbonate (TUMS - dosed in mg elemental calcium) chewable tablet 400 mg of elemental calcium  2 tablet Oral TID WC PRN Katherine RoanMacLean, Matthew, MD   400 mg of elemental calcium at 03/19/19 1334  . escitalopram (LEXAPRO) tablet 10 mg  10 mg Oral Daily Kirt BoysAlexander, Andrew, MD   10 mg at 03/23/19 0851  . folic acid (FOLVITE) tablet 1 mg  1 mg Oral Daily Seawell, Jaimie A, DO   1 mg at 03/23/19 0851  . labetalol (NORMODYNE) injection 5 mg  5 mg Intravenous Once PRN Seawell, Jaimie A, DO      . lisinopril (ZESTRIL) tablet 5 mg  5 mg Oral Daily Kirt BoysAlexander, Andrew, MD       . multivitamin with minerals tablet 1 tablet  1 tablet Oral Daily Seawell, Jaimie A, DO   1 tablet at 03/23/19 0851  . pneumococcal 23 valent vaccine (PNU-IMMUNE) injection 0.5 mL  0.5 mL Intramuscular Once Tyson AliasVincent, Duncan Thomas, MD      . polyvinyl alcohol (LIQUIFILM TEARS) 1.4 % ophthalmic solution 2 drop  2 drop Both Eyes PRN Kirt BoysAlexander, Andrew, MD      . thiamine (VITAMIN B-1) tablet 100 mg  100 mg Oral Daily Seawell, Jaimie A, DO   100 mg at 03/23/19 0850   Or  . thiamine (B-1) injection 100 mg  100 mg Intravenous Daily Seawell, Jaimie A, DO      . vitamin B-12 (CYANOCOBALAMIN) tablet 1,000 mcg  1,000 mcg Oral Daily Kirt BoysAlexander, Andrew, MD   1,000 mcg at 03/23/19 [id number]    Musculoskeletal: Strength & Muscle Tone: No atrophy noted. Gait & Station: UTA since patient is lying in bed. Patient leans: N/A  Psychiatric Specialty Exam: Physical Exam  Nursing note and vitals reviewed. Constitutional: He is oriented to person, place, and time. He appears well-developed and well-nourished.  HENT:  Head: Normocephalic and atraumatic.  Neck: Normal range of motion.  Respiratory: Effort normal.  Musculoskeletal: Normal range of motion.  Neurological: He is alert and oriented to person, place, and time.  Psychiatric: His speech is normal. Judgment and thought content normal. He is withdrawn. Cognition and memory are normal. He exhibits a depressed mood.    Review of Systems  Cardiovascular: Negative for chest pain.  Gastrointestinal: Positive for nausea. Negative for abdominal pain, constipation, diarrhea and vomiting.  Psychiatric/Behavioral: Positive for substance abuse. Negative for hallucinations and suicidal ideas. The patient has insomnia.   All other systems reviewed and are negative.   Blood pressure (!) 149/100, pulse 100, temperature 98 F (36.7 C), temperature source Oral, resp. rate 16, height 6' (1.829 m), weight 92.1 kg,  SpO2 97 %.Body mass index is 27.54 kg/m.  General  Appearance: Fairly Groomed, middle aged, Caucasian male, wearing a hospital gown with unshaved face who is lying in bed. NAD.   Eye Contact:  Good  Speech:  Clear and Coherent and Normal Rate  Volume:  Normal  Mood:  Euthymic  Affect:  Non-Congruent and Depressed  Thought Process:  Goal Directed, Linear and Descriptions of Associations: Intact  Orientation:  Full (Time, Place, and Person)  Thought Content:  Logical  Suicidal Thoughts:  No  Homicidal Thoughts:  No  Memory:  Immediate;   Good Recent;   Good Remote;   Good  Judgement:  Fair  Insight:  Fair  Psychomotor Activity:  Decreased  Concentration:  Concentration: Good and Attention Span: Good  Recall:  Good  Fund of Knowledge:  Good  Language:  Good  Akathisia:  No  Handed:  Right  AIMS (if indicated):   N/A  Assets:  Communication Skills Desire for Improvement Housing  ADL's:  Intact  Cognition:  WNL  Sleep:    Poor     Treatment Plan Summary: Clayton Baldwin is a 48 y.o. male who was admitted on 9/4 with rhabdomyolysis secondary to a suicide attempt by drug overdose. Patient reports a euthymic mood although his affect is flat. He denies SI, HI or AVH. He does not appear to be responding to internal stimuli. He continues to warrant inpatient psychiatric hospitalization due to appearing significantly depressed and serious suicide attempt.   Recommendations. -Patient warrants inpatient psychiatric hospitalization given high risk of harm to self. -Continue bedside sitter.  -Continue Lexapro 10 mg daily for depression. -EKG reviewed and QTc 485 on 9/4. Please closely monitor when starting or increasing QTc prolonging agents.   -Please pursue involuntary commitment if patient refuses voluntary psychiatric hospitalization or attempts to leave the hospital.  -Will sign off on patient at this time. Please consult psychiatry again as needed.     Disposition: Recommend psychiatric Inpatient admission when medically  cleared.  This service was provided via telemedicine using a 2-way, interactive audio and video technology.  Names of all persons participating in this telemedicine service and their role in this encounter. Name: Vanetta Shawl Role: Patient  Name: Buford Dresser, DO Role: [place], DO 03/23/2019 9:38 AM

## 2019-03-23 NOTE — TOC Progression Note (Signed)
Transition of Care [facility]) - Progression Note    Patient Details  Name: Clayton Baldwin MRN: [id number] Date of Birth: Sep 11, 1970  Transition of Care [facility]) CM/SW [place], [place] Phone Number: 03/23/2019, 5:32 PM  Clinical Narrative:     CSW received consult for discharge needs. Patient was seen by psychiatry and was recommended for inpatient psychiatric hospitalization. CSW visit with the patient at bedside. He was alert and oriented. CSW introduced delf and explained reason for consult. Patient was agreeable to inpatient psychiatric treatment and signed [facility] voluntary admissions and consent treatment form (form placed in the chart).   CSW made referral to [facility]. They will review and contact CSW.  CSW will continue to follow and assist with discharge planning.   Thurmond Butts, MSW, LCSWA Clinical Social Worker [phone]     Barriers to Discharge: Continued Medical Work up  Expected Discharge Plan and Services         Living arrangements for the past 2 months: Single Family Home Expected Discharge Date: 03/20/19                                     Social Determinants of Health (SDOH) Interventions    Readmission Risk Interventions No flowsheet data found.

## 2019-03-23 NOTE — Progress Notes (Signed)
Physical Therapy Treatment Patient Details Name: Clayton Baldwin MRN: [id number] DOB: 08/09/1970 Today's Date: 03/23/2019    History of Present Illness Patient is a 48 year old male with a history of HTN, Alcohol use disorder, Major depression, and hepatic steatosis who presented with EMS after being found down at his home four days after he overdosed on Lorazepam, trazodone and alcohol in a suicide attempt.    PT Comments    Patient progressing this session with ability to ambulate in the room.  Significantly limited due to HR elevation with mobility, though progressing to ambulation this session.  Patient agrees there may be anxiety component to HR elevation.  Feel continued skilled PT indicated during acute stay to progress mobility, activity tolerance, safety and independence.  Follow up PT as noted below recommended.    Follow Up Recommendations  Other (comment)(PT follow up in inpatient psychiatric facility)     Equipment Recommendations  Rolling walker with 5" wheels    Recommendations for Other Services       Precautions / Restrictions Precautions Precautions: Fall Precaution Comments: Watch HR, L arm peripheral nerve injury Restrictions Weight Bearing Restrictions: No    Mobility  Bed Mobility Overal bed mobility: Needs Assistance Bed Mobility: Supine to Sit Rolling: Min assist         General bed mobility comments: moving legs off bed and pulled up with R arm to sit  Transfers Overall transfer level: Needs assistance Equipment used: Rolling walker (2 wheeled) Transfers: Sit to/from Stand Sit to Stand: Min assist;+2 physical assistance;+2 safety/equipment         General transfer comment: pt with increased ability to boost himself to standing, steadying assist provided throughout with additional assist to maintain LUE on RW. performed multiple sit<>stands during session with seated rest in between  Ambulation/Gait   Gait Distance (Feet): 8 Feet(x  2) Assistive device: Rolling walker (2 wheeled) Gait Pattern/deviations: Step-to pattern;Shuffle;Decreased stride length;Trunk flexed;Wide base of support     General Gait Details: short shuffling gait with significant anxiety HR up to 180 with ambulation in room chair following for safety with sitter in room, RN aware   Stairs             Wheelchair Mobility    Modified Rankin (Stroke Patients Only)       Balance Overall balance assessment: Needs assistance Sitting-balance support: Feet supported Sitting balance-Leahy Scale: Good     Standing balance support: Bilateral upper extremity supported;Single extremity supported Standing balance-Leahy Scale: Poor Standing balance comment: requires at least single UE support/external assist                            Cognition Arousal/Alertness: Awake/alert Behavior During Therapy: Anxious Overall Cognitive Status: Impaired/Different from baseline Area of Impairment: Safety/judgement                       Following Commands: Follows one step commands consistently Safety/Judgement: Decreased awareness of deficits   Problem Solving: Slow processing;Decreased initiation;Difficulty sequencing;Requires tactile cues;Requires verbal cues General Comments: slightly slow to respond, endorses feeling a littel anxious with mobility tasks       Exercises General Exercises - Upper Extremity Elbow Flexion: AAROM;10 reps;Seated;Left(gravity eliminated) Elbow Extension: AAROM;Left;10 reps;Seated(gravity eliminated) General Exercises - Lower Extremity Long Arc Quad: AROM;10 reps;Seated;Both    General Comments General comments (skin integrity, edema, etc.): Spoke with RN about possible anxiety component of HR elevation and possibly to consider medication (though she  reports started on meds per psych already)      Pertinent Vitals/Pain Pain Assessment: Faces Faces Pain Scale: Hurts a little bit Pain Location:  tingling L arm Pain Descriptors / Indicators: Tingling Pain Intervention(s): Monitored during session;Repositioned    Home Living                      Prior Function            PT Goals (current goals can now be found in the care plan section) Acute Rehab PT Goals Patient Stated Goal: continue to regain independence  Progress towards PT goals: Progressing toward goals    Frequency    Min 3X/week      PT Plan Current plan remains appropriate    Co-evaluation PT/OT/SLP Co-Evaluation/Treatment: Yes Reason for Co-Treatment: For patient/therapist safety;To address functional/ADL transfers PT goals addressed during session: Mobility/safety with mobility;Balance;Proper use of DME        AM-PAC PT "6 Clicks" Mobility   Outcome Measure  Help needed turning from your back to your side while in a flat bed without using bedrails?: A Little Help needed moving from lying on your back to sitting on the side of a flat bed without using bedrails?: A Little Help needed moving to and from a bed to a chair (including a wheelchair)?: A Lot Help needed standing up from a chair using your arms (e.g., wheelchair or bedside chair)?: A Lot Help needed to walk in hospital room?: A Lot Help needed climbing 3-5 steps with a railing? : Total 6 Click Score: 13    End of Session Equipment Utilized During Treatment: Gait belt Activity Tolerance: Treatment limited secondary to medical complications (Comment)(HR up to 180) Patient left: in chair;with call bell/phone within reach;with nursing/sitter in room   PT Visit Diagnosis: Other abnormalities of gait and mobility (R26.89);Muscle weakness (generalized) (M62.81)     Time: [id number] PT Time Calculation (min) (ACUTE ONLY): 23 min  Charges:  $Gait Training: 8-22 mins                     Sheran LawlessCyndi , [place] CarolinaPT Acute Rehabilitation Services [phone] 03/23/2019    Elray Mcgregorynthia  03/23/2019, 6:03 PM

## 2019-03-23 NOTE — Progress Notes (Signed)
Occupational Therapy Treatment Patient Details Name: Clayton RalphsBenjamin Baldwin MRN: [id number] DOB: 04/23/1971 Today's Date: 03/23/2019    History of present illness Patient is a 48 year old male with a history of HTN, Alcohol use disorder, Major depression, and hepatic steatosis who presented with EMS after being found down at his home four days after he overdosed on Lorazepam, trazodone and alcohol in a suicide attempt.   OT comments  Pt progressing towards therapy goals. He was able to tolerate short distance mobility in room using RW with two person assist. Pt with limitations in activity due to elevated HR with sitting EOB and any mobility. Max HR noted 177 with standing activity today; with seated rest HR decreases to 140s. Pt continues to require maxA for toileting ADL due to decreased standing balance and LUE deficits. Pt also endorses visual deficits including significant blurred vision and intermittent double vision (reports glasses were lost while at hospital). Feel POC remains appropriate at this time. Will continue to follow acutely.    Follow Up Recommendations  Supervision/Assistance - 24 hour(noted plans for inpt psych )    Equipment Recommendations  None recommended by OT          Precautions / Restrictions Precautions Precautions: Fall Precaution Comments: Watch HR, L arm peripheral nerve injury Restrictions Weight Bearing Restrictions: No       Mobility Bed Mobility Overal bed mobility: Needs Assistance Bed Mobility: Supine to Sit Rolling: Min assist         General bed mobility comments: moving legs off bed and pulled up with R arm to sit  Transfers Overall transfer level: Needs assistance Equipment used: Rolling walker (2 wheeled) Transfers: Sit to/from Stand Sit to Stand: Min assist;+2 physical assistance;+2 safety/equipment         General transfer comment: pt with increased ability to boost himself to standing, steadying assist provided throughout with  additional assist to maintain LUE on RW. performed multiple sit<>stands during session with seated rest in between    Balance Overall balance assessment: Needs assistance Sitting-balance support: Feet supported Sitting balance-Leahy Scale: Fair     Standing balance support: Bilateral upper extremity supported;Single extremity supported Standing balance-Leahy Scale: Poor Standing balance comment: requires at least single UE support/external assist                           ADL either performed or assessed with clinical judgement   ADL Overall ADL's : Needs assistance/impaired Eating/Feeding: Set up;Sitting                           Toileting- Clothing Manipulation and Hygiene: Maximal assistance;+2 for physical assistance;+2 for safety/equipment;Sit to/from stand Toileting - Clothing Manipulation Details (indicate cue type and reason): pt with some BM noted upon standing from EOB, requires assist for balance and for peri-care reports feeling he cannot safely attempt at this time while maintaining his balance     Functional mobility during ADLs: Minimal assistance;+2 for physical assistance;+2 for safety/equipment;Rolling walker General ADL Comments: pt with improvements in mobility and functional status today. continues to have limittations due to elevated HR     Vision   Additional Comments: pt reports glasses were lost with initial admit to hospital, reports intermittently seeing double vision and significantly blurred vision without his glasses    Perception     Praxis      Cognition Arousal/Alertness: Awake/alert Behavior During Therapy: Anxious;WFL for tasks assessed/performed Overall Cognitive Status:  Impaired/Different from baseline Area of Impairment: Safety/judgement                       Following Commands: Follows one step commands consistently Safety/Judgement: Decreased awareness of deficits   Problem Solving: Slow  processing;Decreased initiation;Difficulty sequencing;Requires tactile cues;Requires verbal cues General Comments: slightly slow to respond, endorses feeling a littel anxious with mobility tasks         Exercises General Exercises - Upper Extremity Elbow Flexion: AAROM;10 reps;Seated;Left(gravity eliminated) Elbow Extension: AAROM;Left;10 reps;Seated(gravity eliminated)   Shoulder Instructions       General Comments      Pertinent Vitals/ Pain       Pain Assessment: Faces Faces Pain Scale: Hurts a little bit Pain Location: tingling L arm Pain Descriptors / Indicators: Tingling Pain Intervention(s): Monitored during session;Repositioned  Home Living                                          Prior Functioning/Environment              Frequency  Min 2X/week        Progress Toward Goals  OT Goals(current goals can now be found in the care plan section)  Progress towards OT goals: Progressing toward goals  Acute Rehab OT Goals Patient Stated Goal: continue to regain independence  ADL Goals Pt Will Perform Grooming: with set-up Pt Will Perform Upper Body Bathing: with set-up Pt Will Perform Lower Body Bathing: with min assist Pt Will Perform Upper Body Dressing: with supervision Pt Will Perform Lower Body Dressing: with min assist Pt Will Transfer to Toilet: with min assist Pt Will Perform Toileting - Clothing Manipulation and hygiene: with mod assist  Plan Discharge plan remains appropriate    Co-evaluation                 AM-PAC OT "6 Clicks" Daily Activity     Outcome Measure   Help from another person eating meals?: A Little Help from another person taking care of personal grooming?: A Little Help from another person toileting, which includes using toliet, bedpan, or urinal?: A Lot Help from another person bathing (including washing, rinsing, drying)?: A Lot Help from another person to put on and taking off regular upper body  clothing?: A Lot Help from another person to put on and taking off regular lower body clothing?: A Lot 6 Click Score: 14    End of Session Equipment Utilized During Treatment: Gait belt;Rolling walker  OT Visit Diagnosis: Muscle weakness (generalized) (M62.81);History of falling (Z91.81);Unsteadiness on feet (R26.81)   Activity Tolerance Patient tolerated treatment well   Patient Left in chair;with call bell/phone within reach;with nursing/sitter in room   Nurse Communication Mobility status        Time: 2725-3664 OT Time Calculation (min): 23 min  Charges: OT General Charges $OT Visit: 1 Visit OT Treatments $Self Care/Home Management : 8-22 mins  Lou Cal, [place] Pager [phone] Office [phone]   Raymondo Band 03/23/2019, 4:57 PM

## 2019-03-24 LAB — CBC
HCT: 34 % — ABNORMAL LOW (ref 39.0–52.0)
Hemoglobin: 12.1 g/dL — ABNORMAL LOW (ref 13.0–17.0)
MCH: 38.3 pg — ABNORMAL HIGH (ref 26.0–34.0)
MCHC: 35.6 g/dL (ref 30.0–36.0)
MCV: 107.6 fL — ABNORMAL HIGH (ref 80.0–100.0)
Platelets: 240 10*3/uL (ref 150–400)
RBC: 3.16 MIL/uL — ABNORMAL LOW (ref 4.22–5.81)
RDW: 11.8 % (ref 11.5–15.5)
WBC: 7.7 10*3/uL (ref 4.0–10.5)
nRBC: 0 % (ref 0.0–0.2)

## 2019-03-24 LAB — COMPREHENSIVE METABOLIC PANEL
ALT: 95 U/L — ABNORMAL HIGH (ref 0–44)
AST: 109 U/L — ABNORMAL HIGH (ref 15–41)
Albumin: 2.3 g/dL — ABNORMAL LOW (ref 3.5–5.0)
Alkaline Phosphatase: 32 U/L — ABNORMAL LOW (ref 38–126)
Anion gap: 10 (ref 5–15)
BUN: 6 mg/dL (ref 6–20)
CO2: 25 mmol/L (ref 22–32)
Calcium: 8.3 mg/dL — ABNORMAL LOW (ref 8.9–10.3)
Chloride: 99 mmol/L (ref 98–111)
Creatinine, Ser: 0.73 mg/dL (ref 0.61–1.24)
GFR calc Af Amer: >60 mL/min (ref >60)
GFR calc non Af Amer: >60 mL/min (ref >60)
Glucose, Bld: 104 mg/dL — ABNORMAL HIGH (ref 70–99)
Potassium: 3.5 mmol/L (ref 3.5–5.1)
Sodium: 134 mmol/L — ABNORMAL LOW (ref 135–145)
Total Bilirubin: 1.2 mg/dL (ref 0.3–1.2)
Total Protein: 4.7 g/dL — ABNORMAL LOW (ref 6.5–8.1)

## 2019-03-24 LAB — GLUCOSE, CAPILLARY: Glucose-Capillary: 102 mg/dL — ABNORMAL HIGH (ref 70–99)

## 2019-03-24 NOTE — TOC Progression Note (Signed)
Transition of Care [facility]) - Progression Note    Patient Details  Name: Clayton Baldwin MRN: [id number] Date of Birth: March 19, 1971  Transition of Care [facility]) CM/SW [place], [place] Phone Number: 03/24/2019, 4:22 PM  Clinical Narrative:     4:21pm CSW called [facility] was informed admitting coordinator will need to review today. They will call contact CSW.  3:29pm -call [place]- no answer 3:26pm- made referral to [place] - will review and contact CSW   Thurmond Butts, MSW, [facility] Clinical Social Worker [phone]     Barriers to Discharge: Continued Medical Work up  Expected Discharge Plan and [place] arrangements for the past 2 months: Single Family Home Expected Discharge Date: 03/20/19                                     Social Determinants of Health (SDOH) Interventions    Readmission Risk Interventions No flowsheet data found.

## 2019-03-24 NOTE — Progress Notes (Signed)
Subjective: Pt seen at the bedside on rounds this AM.  Patient says he feels well but his shoulder is a little sore.  Tylenol is helpful.  No other complaints today.  Understands he is waiting for transfer to inpatient psych to continue treatment of depression and SI.  Objective:  Vital signs in last 24 hours: Vitals:   03/23/19 1737 03/23/19 1900 03/24/19 0004 03/24/19 0400  BP: (!) 137/95 (!) 136/96 (!) 144/100 (!) 133/107  Pulse: 89 96 90 (!) 104  Resp:  17 13 11   Temp: 99.2 F (37.3 C) 98.3 F (36.8 C) 98.5 F (36.9 C) 98.9 F (37.2 C)  TempSrc: Oral Oral Oral Oral  SpO2: 98% 100% 99% 99%  Weight:    87.1 kg  Height:       Physical Exam: General: Comfortable appearing resting in bed, NAD HEENT: L eye bruising present CV: Warm and well-perfused d PULM: Normal work of breathing MSK: Left upper arm weakness 2/5 NEURO: Alert and oriented x4, sensation intact in bilateral upper and lower extremities.  Assessment/Plan:  Principal Problem:   Severe episode of recurrent major depressive disorder, without psychotic features (HCC) Active Problems:   Rhabdomyolysis   Suicide attempt (HCC)   Thrombocytopenia (HCC)   AKI (acute kidney injury) (HCC)   Hematoma of eyelid   Alcohol use disorder, severe, dependence (HCC)   Subconjunctival hemorrhage, traumatic, right  In summary, [name] isa 48 year old male with a history of HTN, polysubstance use disorder, depression and hepatic steatosis who presented to the ER after being found down at home for unknown known amount of time. He is suspected to have been down for 4 days after taking Lorazepam, Trazodone and alcohol as anattempted suicide. Pt was admitted forrhabdomyolysis with CK greater than 8000, L eye injury,andthrombocytopenia of65, whichhave all improved with IV fluids. At this point in time, the patient is medically stable for discharge.  #Depression with SI #Suicide Attempt  #PolysubstanceUseDisorder:Psychiatry saw the patient yesterday and still recommends inpatient psych to manage SI. - Appreciate Psychiatry recommendations.  Will work on inpatient psych placement. - Lexapro 10 mg  #Rhabdomyolysis:Improving.On admission CK [phone], and has downtrended to 1498 as of yesterday.  -Daily labs (CBC, BMP) - D/c IV fluids -EncouragePO intake  #LUE Swelling #LUE Weakness:Patienthas mildly improved 2/5 strengthin theLUE.Was 0/5 on previous days.LUE Doppler study negative for DVT. MRI of his cervical spine, L shoulder and chest were performed. MRI C-spine was notable for degenerative spinal stenosis and by for minimal impingement from C3-4, C6-7 and flattening of the spinal cord at C5-7. MRI L shoulder notable for superior subscapularis partial tear and small glenohumeral joint effusion. MRI chest notable for rhabdo of chest and L side of the neck.  Neurology & Neurosurgery as seen the patient and reccomendations are below. -PT/OTevaluation: recommend follow-up at psychiatric hosptial -Address cervical spondylosis and stenosis after he is recovered from this current injury.  - May consider L shoulder brachial plexus MRI and/or EMG studies as an outpatient   #Hx of Keratoconus #R Eye Traumatic Injury:Improving.Seen by Dr. Alben SpittleWeaver with ophthalmology.Appreciate recommendations. - Erythromycin ointment x5 days(day 5/5) - Artificial tears PRN  #Transaminitis #Hepatic steatosis:Patient statesthat he had hepatitis as achild.Was worked up in 2017 by GI fortransaminitis, but was negative for viral hepatitis.Patient had significantly elevated ferritin levels but was negative for hemochromatosis. His anti-smooth muscle antibody titer was 1:28 and GI wanted to do a liver biopsy but was lost to follow-up. Patient'sinitiallabsincluding AST:ALT - 207:52, elevated INR,and CT findings of hepatic  steatosis are suggestive toalcohol-related hepatic  injury.Most recentAST:ALT now 109:95. -Consider GI f/u in the outpatient setting  #FEN/GI - Diet: Regular -Fluids: none  #Code: Full  #Dispo:The patient is currently medically stable for discharge.Patient needs inpatient psych placement.   Earlene Plater, MD Internal Medicine, PGY1 Pager: [phone]  03/24/2019,1:18 PM

## 2019-03-24 NOTE — TOC Progression Note (Signed)
Transition of Care [facility]) - Progression Note    Patient Details  Name: Clayton Baldwin MRN: [id number] Date of Birth: July 11, 1971  Transition of Care [facility]) CM/SW [place], [place] Phone Number: 03/24/2019, 11:55 AM  Clinical Narrative:     CSW called to follow up on [facility] referral - awaiting response for bed offer.  Thurmond Butts, MSW, LCSWA Clinical Social Worker [phone]      Barriers to Discharge: Continued Medical Work up  Expected Discharge Plan and Services         Living arrangements for the past 2 months: Single Family Home Expected Discharge Date: 03/20/19                                     Social Determinants of Health (SDOH) Interventions    Readmission Risk Interventions No flowsheet data found.

## 2019-03-25 NOTE — Progress Notes (Signed)
Subjective: Patient seen at the bedside on rounds this morning.  Has no complaints today.  Says he would like to call his family today.  I told him I would tell his nurse.   Objective:  Vital signs in last 24 hours: Vitals:   03/24/19 1526 03/24/19 2100 03/25/19 0000 03/25/19 0403  BP: (!) 139/93 (!) 149/95 140/81 131/81  Pulse: 94 94 96 80  Resp: 17 20 20 18   Temp: 98.6 F (37 C) 98.9 F (37.2 C) 98.9 F (37.2 C) 98.7 F (37.1 C)  TempSrc: Oral Oral Oral Oral  SpO2: 99% 99%  99%  Weight:      Height:       Physical Exam: General: Resting in bed, NAD HEENT: Right eye bruising & scabbing CV: RRR, normal S1 and S2 no murmurs rubs or gallops appreciated PULM: Clear to auscultation bilaterally MSK: Left arm 2/5 flexion and extension NEURO: Alert and oriented x4.  Sensation intact to gross touch in bilateral upper and lower extremities  Assessment/Plan:  Principal Problem:   Severe episode of recurrent major depressive disorder, without psychotic features ([place]) Active Problems:   Rhabdomyolysis   Suicide attempt ([place])   Thrombocytopenia ([place])   AKI (acute kidney injury) ([place])   Hematoma of eyelid   Alcohol use disorder, severe, dependence ([place])   Subconjunctival hemorrhage, traumatic, right  In summary, Mr. Smalls isa 48 year old male with a history of HTN, polysubstance use disorder, depression and hepatic steatosis who presented to the ER after being found down at home for unknown known amount of time. He is suspected to have been down for 4 days after taking Lorazepam, Trazodone and alcohol as anattempted suicide. Pt was admitted forrhabdomyolysis with CK greater than 8000, L eye injury,andthrombocytopenia of65, whichhave all improved withIV fluids. At this point in time, the patient is medically stable for discharge.  #Depression with SI #Suicide Attempt #PolysubstanceUseDisorder -Psychiatry was consulted and recommends inpatient psych placement.  Case  management is working on finding the patient in bed.  -Continue Lexapro 10 mg daily   #Rhabdomyolysis:Resolved.On admission CK was8252, and has downtrended to 1498.  -EncouragePO intake  #LUE Swelling #LUE Weakness:Patienthas 2/5 strengthin theLUE.Was 0/5 on previous days.LUE Doppler study negative for DVT. MRI of his cervical spine, L shoulder and chest were performed. MRI C-spine was notable for degenerative spinal stenosis and by for minimal impingement from C3-4, C6-7 and flattening of the spinal cord at C5-7. MRI L shoulder notable for superior subscapularis partial tear and small glenohumeral joint effusion. MRI chest notable for rhabdo of chest and L side of the neck.  Neurology & Neurosurgery as seen the patient and reccomendations are below. -PT/OTevaluation: recommend follow-up at psychiatric hosptial -Address cervical spondylosis and stenosis after he is recovered from this current injury.  - May consider L shoulder brachial plexus MRI and/or EMG studies as an outpatient   #Hx of Keratoconus #R Eye Traumatic Injury:Improving.Seen by Dr. Kathlen Mody with ophthalmology.Appreciate recommendations. - Completed 5 day course of Erythromycin ointment x5 - Artificial tears PRN  #Transaminitis #Hepatic steatosis:Patient statesthat he had hepatitis as achild.Was worked up in 2017 by GI fortransaminitis, but was negative for viral hepatitis.Patient had significantly elevated ferritin levels but was negative for hemochromatosis. His anti-smooth muscle antibody titer was 1:28 and GI wanted to do a liver biopsy but was lost to follow-up. Patient'sinitiallabsincluding AST:ALT - 207:52, elevated INR,and CT findings of hepatic steatosis are suggestive toalcohol-related hepatic injury.Most recentAST:ALT now109:95. -Consider GI f/u in the outpatient setting  #FEN/GI -Diet:Regular -Fluids:  none  #Code: Full  #Dispo:The patient is currently medically stable  for discharge.Patient needs inpatient psych placement.   Kirt BoysAndrew Kateena Degroote, MD Internal Medicine, PGY1 Pager: [phone]  03/25/2019,9:59 AM

## 2019-03-26 LAB — GLUCOSE, CAPILLARY: Glucose-Capillary: 104 mg/dL — ABNORMAL HIGH (ref 70–99)

## 2019-03-26 NOTE — Progress Notes (Signed)
Pt's dinner order placed

## 2019-03-26 NOTE — Progress Notes (Signed)
Pt given breakfast tray and setup 

## 2019-03-26 NOTE — Progress Notes (Signed)
Pt given lunch tray.

## 2019-03-26 NOTE — Progress Notes (Signed)
Subjective: Pt seen at the bedside on rounds this AM.  Has no complaints today. Says he is able to extend his left arm better than previous days, but still has difficulty curling it.  Objective:  Vital signs in last 24 hours: Vitals:   03/25/19 1930 03/26/19 0007 03/26/19 0500 03/26/19 0744  BP: 109/71 (!) 141/85 124/86 132/76  Pulse: 82 76 77 78  Resp: 16 14 16    Temp: 98.3 F (36.8 C) 98.7 F (37.1 C) 98.5 F (36.9 C) 98.6 F (37 C)  TempSrc: Oral Oral Oral Oral  SpO2: 99% 98% 99% 98%  Weight:   86.1 kg   Height:       Physical Exam: General: Resting in bed comfortably, NAD HEENT: L eye bruising present CV: Warm and well-perfused PULM: Normal work of breathing MSK: Left arm 2/5 flexion, 3/5 extension NEURO: Alert and oriented x4, sensation decreased in the C8 distribution left arm.  Assessment/Plan:  Principal Problem:   Severe episode of recurrent major depressive disorder, without psychotic features (HCC) Active Problems:   Rhabdomyolysis   Suicide attempt (HCC)   Thrombocytopenia (HCC)   AKI (acute kidney injury) (HCC)   Hematoma of eyelid   Alcohol use disorder, severe, dependence (HCC)   Subconjunctival hemorrhage, traumatic, right  In summary, Clayton Baldwin isa 48 year old male with a history of HTN, polysubstance use disorder, depression and hepatic steatosis who presented to the ER after being found down at home for unknown known amount of time. He is suspected to have been down for 4 days after taking Lorazepam,Trazodone and alcohol as anattempted suicide. Pt was admitted forrhabdomyolysis with CK greater than 8000, L eye injury,andthrombocytopenia of65, whichhave all improved withIV fluids. At this point in time, the patient is medically stable for discharge.  #Depression with SI #Suicide Attempt #PolysubstanceUseDisorder -Psychiatry was consulted and recommends inpatient psych placement.  Case management is working on finding the patient in  bed.  -Continue Lexapro 10 mg daily   #Rhabdomyolysis:Resolved.On admission CK was8252,and has downtrended [id number].  -EncouragePO intake  #LUE Swelling #LUE Weakness:LUE Doppler study negative for DVT. MRI of his cervical spine, L shoulder and chest were performed. MRI C-spinewas notable for degenerative spinal stenosis and by for minimal impingement from C3-4, C6-7 and flattening of the spinal cord at C5-7. MRI L shoulder notable for superior subscapularis partial tear and small glenohumeral joint effusion. MRI chest notable for rhabdo of chest and L side of the neck.Neurology & Neurosurgery as seen the patient and reccomendations are below. -PT/OTevaluation: recommend follow-up at psychiatric hosptial -May address cervical spondylosis and stenosis after he is recovered from this current injury.  - May consider L shoulder brachial plexus MRI and/or EMG studies as an outpatient   #Hx of Keratoconus #R Eye Traumatic Injury:Improving.Seen by Dr. Alben SpittleWeaver with ophthalmology.Appreciate recommendations. - Completed 5 day course of Erythromycin ointment x5 - Artificial tears PRN  #Transaminitis #Hepatic steatosis:Patient statesthat he had hepatitis as achild.Was worked up in 2017 by GI fortransaminitis, but was negative for viral hepatitis.Patient had significantly elevated ferritin levels but was negative for hemochromatosis. His anti-smooth muscle antibody titer was 1:28 and GI wanted to do a liver biopsy but was lost to follow-up. Patient'sinitiallabsincluding AST:ALT - 207:52, elevated INR,and CT findings of hepatic steatosis are suggestive toalcohol-related hepatic injury. -Consider GI f/u in the outpatient setting - CMP tomorrow morning  #FEN/GI -Diet:Regular -Fluids: none  #Code: Full  #Dispo:The patient is currently medically stable for discharge.Patient needs inpatient psych placement.  Clayton BoysAndrew Sami Froh, MD Internal Medicine,  PGY1 Pager:  [phone]  03/26/2019,11:21 AM

## 2019-03-26 NOTE — TOC Progression Note (Signed)
Transition of Care [facility]) - Progression Note    Patient Details  Name: Clayton Baldwin MRN: [id number] Date of Birth: 1971-07-01  Transition of Care [facility]) CM/SW [place], LCSW Phone Number: [phone] 03/26/2019, 10:01 AM  Clinical Narrative:     CSW spoke with Chrys Racer at [facility] and she stated that there was no beds available. More discharges on Monday and to call back then.  CSW will continue to follow for discharge planning.      Barriers to Discharge: Continued Medical Work up  Expected Discharge Plan and Services         Living arrangements for the past 2 months: Single Family Home Expected Discharge Date: 03/20/19                                     Social Determinants of Health (SDOH) Interventions    Readmission Risk Interventions No flowsheet data found.

## 2019-03-27 LAB — COMPREHENSIVE METABOLIC PANEL
ALT: 131 U/L — ABNORMAL HIGH (ref 0–44)
AST: 97 U/L — ABNORMAL HIGH (ref 15–41)
Albumin: 2.5 g/dL — ABNORMAL LOW (ref 3.5–5.0)
Alkaline Phosphatase: 43 U/L (ref 38–126)
Anion gap: 12 (ref 5–15)
BUN: 11 mg/dL (ref 6–20)
CO2: 23 mmol/L (ref 22–32)
Calcium: 8.6 mg/dL — ABNORMAL LOW (ref 8.9–10.3)
Chloride: 99 mmol/L (ref 98–111)
Creatinine, Ser: 0.92 mg/dL (ref 0.61–1.24)
GFR calc Af Amer: >60 mL/min (ref >60)
GFR calc non Af Amer: >60 mL/min (ref >60)
Glucose, Bld: 86 mg/dL (ref 70–99)
Potassium: 3.2 mmol/L — ABNORMAL LOW (ref 3.5–5.1)
Sodium: 134 mmol/L — ABNORMAL LOW (ref 135–145)
Total Bilirubin: 1 mg/dL (ref 0.3–1.2)
Total Protein: 5.1 g/dL — ABNORMAL LOW (ref 6.5–8.1)

## 2019-03-27 LAB — GLUCOSE, CAPILLARY: Glucose-Capillary: 96 mg/dL (ref 70–99)

## 2019-03-27 LAB — MAGNESIUM: Magnesium: 1.5 mg/dL — ABNORMAL LOW (ref 1.7–2.4)

## 2019-03-27 MED ORDER — POTASSIUM CHLORIDE CRYS ER 20 MEQ PO TBCR
40.0000 meq | EXTENDED_RELEASE_TABLET | Freq: Once | ORAL | Status: AC
  Administered 2019-03-27: 10:00:00 40 meq via ORAL
  Filled 2019-03-27: qty 4

## 2019-03-27 MED ORDER — DOCUSATE SODIUM 100 MG PO CAPS
100.0000 mg | ORAL_CAPSULE | Freq: Two times a day (BID) | ORAL | Status: DC
  Administered 2019-03-27 – 2019-03-31 (×8): 100 mg via ORAL
  Filled 2019-03-27 (×6): qty 1

## 2019-03-27 MED ORDER — POTASSIUM CHLORIDE CRYS ER 20 MEQ PO TBCR
40.0000 meq | EXTENDED_RELEASE_TABLET | Freq: Once | ORAL | Status: AC
  Administered 2019-03-27: 17:00:00 40 meq via ORAL
  Filled 2019-03-27: qty 2

## 2019-03-27 NOTE — Progress Notes (Addendum)
   Subjective: Patient seen at the bedside on rounds this morning.  Has no complaints today.  Weakness in his arm has not improved or worsened.  Objective:  Vital signs in last 24 hours: Vitals:   03/26/19 1946 03/27/19 0036 03/27/19 0443 03/27/19 0606  BP: 132/89 125/82 128/81   Pulse: 82 82 73   Resp: 17 15 13 14   Temp: 98.9 F (37.2 C) 98.4 F (36.9 C) 98.5 F (36.9 C)   TempSrc: Oral Oral Oral   SpO2: 100% 100% 99%   Weight:    86.3 kg  Height:       Physical Exam: General: Resting in bed, NAD HEENT: Right eye bruising & scabbing CV: Warm and well-perfused PULM: Normal work of breathing MSK: Left arm 0/5 flexion and 3/5 extension NEURO: Alert and oriented x4.  Decreased sensation in the left hand in the ulnar distribution.  Assessment/Plan:  Principal Problem:   Severe episode of recurrent major depressive disorder, without psychotic features ([place]) Active Problems:   Rhabdomyolysis   Suicide attempt ([place])   Thrombocytopenia ([place])   AKI (acute kidney injury) ([place])   Hematoma of eyelid   Alcohol use disorder, severe, dependence ([place])   Subconjunctival hemorrhage, traumatic, right  In summary, Clayton Baldwin is a 48 year old male with a history of HTN, polysubstance use disorder, depression and hepatic steatosis who presented to the ER after being found down at home for unknown known amount of time. He is suspected to have been down for 4 days after taking Lorazepam, Trazodone and alcohol as an attempted suicide. Pt was admitted for rhabdomyolysis with CK greater than 8000, L eye injury, and thrombocytopenia of 65, which have all improved with IV fluids. At this point in time, the patient is medically stable for discharge.   #Depression with SI #Suicide Attempt #Polysubstance Use Disorder  Psychiatry was consulted and recommends inpatient psych placement.  Case management is working on finding the patient in bed.   Continue Lexapro 10 mg daily    #Rhabdomyolysis:  Resolved.On admission CK was 8252, and downtrended to 1498.  No longer checking CK.  Encourage PO intake   #LUE Swelling #LUE Weakness:  LUE Doppler study negative for DVT. MRI of his cervical spine, L shoulder and chest were performed. MRI C-spine was notable for degenerative spinal stenosis and by for minimal impingement from C3-4, C6-7 and flattening of the spinal cord at C5-7.  This is likely causing his LUE weakness. MRI L shoulder notable for superior subscapularis partial tear and small glenohumeral joint effusion. MRI chest notable for rhabdo of chest and L side of the neck.  Neurology & Neurosurgery as seen the patient and reccomendations are below. - PT/OT recommend further therapy at [facility]   #FEN/GI - Diet: Regular - Fluids: none   #Code: Full   #Dispo: The patient is currently medically stable for discharge. Patient needs inpatient psych placement.   Clayton Plater, MD Internal Medicine, PGY1 Pager: [phone]  03/27/2019,9:56 AM

## 2019-03-27 NOTE — TOC Progression Note (Addendum)
Transition of Care [facility]) - Progression Note    Patient Details  Name: Quamere Mussell MRN: [id number] Date of Birth: [birth year]  Transition of Care [facility]) CM/SW [place], [place] Phone Number: 03/27/2019, 11:34 AM  Clinical Narrative:     Rejeana Brock spoke with Heather,currently no beds available. [facility] anticipates discharges and will contact CSW.  CSW will continue to follow and assist with disposition.   Thurmond Butts, MSW, LCSWA Clinical Social Worker [phone]     Barriers to Discharge: Continued Medical Work up  Expected Discharge Plan and Services         Living arrangements for the past 2 months: Single Family Home Expected Discharge Date: 03/20/19                                     Social Determinants of Health (SDOH) Interventions    Readmission Risk Interventions No flowsheet data found.

## 2019-03-27 NOTE — Progress Notes (Signed)
Physical Therapy Treatment Patient Details Name: Clayton Baldwin MRN: [id number] DOB: February 13, 1971 Today's Date: 03/27/2019    History of Present Illness Patient is a 48 year old male with a history of HTN, Alcohol use disorder, Major depression, and hepatic steatosis who presented with EMS after being found down at his home four days after he overdosed on Lorazepam, trazodone and alcohol in a suicide attempt.    PT Comments    Patient progressing with mobility, but remains significantly limited with elevated HR up to 175 with mobility and attempting to have BM today.  RN noted and in room throughout.  Feel if could be addressed would improve progress.  PT to follow.    Follow Up Recommendations  Other (comment)(inpatient psych facility)     Equipment Recommendations  Rolling walker with 5" wheels    Recommendations for Other Services       Precautions / Restrictions Precautions Precautions: Fall Precaution Comments: Watch HR, L arm peripheral nerve injury    Mobility  Bed Mobility Overal bed mobility: Needs Assistance       Supine to sit: Min assist     General bed mobility comments: assist to lift trunk  Transfers Overall transfer level: Needs assistance Equipment used: Rolling walker (2 wheeled);1 person hand held assist Transfers: Sit to/from Stand Sit to Stand: Min assist;Mod assist         General transfer comment: able to rise from EOB with RW and assist to placed L hand on walker, from toilet min A with R HHA  Ambulation/Gait Ambulation/Gait assistance: Mod assist;Min assist;+2 safety/equipment Gait Distance (Feet): 12 Feet(& 6') Assistive device: Rolling walker (2 wheeled);1 person hand held assist Gait Pattern/deviations: Step-to pattern;Decreased stride length;Wide base of support     General Gait Details: very wide BOS and shuffling steps with HR up to 175, RN in the room; walked to bathroom per pt request to have BM, then out of BR to  recliner   Stairs             Wheelchair Mobility    Modified Rankin (Stroke Patients Only)       Balance Overall balance assessment: Needs assistance   Sitting balance-Leahy Scale: Good       Standing balance-Leahy Scale: Poor Standing balance comment: needs at least one UE support                            Cognition Arousal/Alertness: Awake/alert Behavior During Therapy: Anxious;Impulsive Overall Cognitive Status: Impaired/Different from baseline Area of Impairment: Safety/judgement                       Following Commands: Follows one step commands consistently;Follows multi-step commands with increased time Safety/Judgement: Decreased awareness of deficits            Exercises      General Comments General comments (skin integrity, edema, etc.): RN in room and concerned about HR, Discussed this is typical for during mobility with minimal symptoms, but noted pt with BP 94/52 and diaphoretic after trying to have BM in bathroom      Pertinent Vitals/Pain Pain Assessment: Faces Faces Pain Scale: Hurts little more Pain Location: tingling L arm Pain Descriptors / Indicators: Tingling Pain Intervention(s): Monitored during session;Repositioned    Home Living                      Prior Function  PT Goals (current goals can now be found in the care plan section) Progress towards PT goals: Progressing toward goals    Frequency    Min 3X/week      PT Plan      Co-evaluation              AM-PAC PT "6 Clicks" Mobility   Outcome Measure  Help needed turning from your back to your side while in a flat bed without using bedrails?: A Little Help needed moving from lying on your back to sitting on the side of a flat bed without using bedrails?: A Little Help needed moving to and from a bed to a chair (including a wheelchair)?: A Little Help needed standing up from a chair using your arms (e.g.,  wheelchair or bedside chair)?: A Little Help needed to walk in hospital room?: A Lot Help needed climbing 3-5 steps with a railing? : Total 6 Click Score: 15    End of Session   Activity Tolerance: Treatment limited secondary to medical complications (Comment) Patient left: in chair;with call bell/phone within reach;with nursing/sitter in room   PT Visit Diagnosis: Other abnormalities of gait and mobility (R26.89);Muscle weakness (generalized) (M62.81)     Time: [id number] PT Time Calculation (min) (ACUTE ONLY): 35 min  Charges:  $Gait Training: 8-22 mins $Therapeutic Activity: 8-22 mins                     Sheran LawlessCyndi Francis Yardley, PT Acute Rehabilitation Services [phone] 03/27/2019    Elray Mcgregorynthia Hareem Surowiec 03/27/2019, 2:02 PM

## 2019-03-27 NOTE — Progress Notes (Signed)
Pts heartrate 170's while in bathroom/ up with PT.  C/O of "lightheadedness", no CP no SOB.  Up in chair after and HR trending down, 120's.  BP a bit soft, see flowsheet.  Set to frequent VS checks, and MD aware.  No new orders at this time.

## 2019-03-27 NOTE — TOC Progression Note (Signed)
Transition of Care [facility]) - Progression Note    Patient Details  Name: Clayton Baldwin MRN: [id number] Date of Birth: [birth year]  Transition of Care [facility]) CM/SW [place], [place] Phone Number: 03/27/2019, 3:07 PM  Clinical Narrative:     [facility]- [facility] bed [facility]  [place] - no bed available    Thurmond Butts, MSW, LCSWA Clinical Social Worker [phone]      Barriers to Discharge: Continued Medical Work up  Expected Discharge Plan and [place] arrangements for the past 2 months: Single Family Home Expected Discharge Date: 03/20/19                                     Social Determinants of Health (SDOH) Interventions    Readmission Risk Interventions No flowsheet data found.

## 2019-03-27 NOTE — Progress Notes (Signed)
Pt out of bed to chair - expected orthostasis process, VS set to frequent, MD aware

## 2019-03-28 DIAGNOSIS — M25512 Pain in left shoulder: Secondary | ICD-10-CM

## 2019-03-28 LAB — BASIC METABOLIC PANEL
Anion gap: 11 (ref 5–15)
BUN: 10 mg/dL (ref 6–20)
CO2: 23 mmol/L (ref 22–32)
Calcium: 8.8 mg/dL — ABNORMAL LOW (ref 8.9–10.3)
Chloride: 101 mmol/L (ref 98–111)
Creatinine, Ser: 0.75 mg/dL (ref 0.61–1.24)
GFR calc Af Amer: >60 mL/min (ref >60)
GFR calc non Af Amer: >60 mL/min (ref >60)
Glucose, Bld: 86 mg/dL (ref 70–99)
Potassium: 3.8 mmol/L (ref 3.5–5.1)
Sodium: 135 mmol/L (ref 135–145)

## 2019-03-28 LAB — GLUCOSE, CAPILLARY: Glucose-Capillary: 94 mg/dL (ref 70–99)

## 2019-03-28 LAB — CK: Total CK: 92 U/L (ref 49–397)

## 2019-03-28 MED ORDER — MAGNESIUM SULFATE 50 % IJ SOLN
3.0000 g | Freq: Once | INTRAVENOUS | Status: AC
  Administered 2019-03-28: 3 g via INTRAVENOUS
  Filled 2019-03-28: qty 6

## 2019-03-28 MED ORDER — IBUPROFEN 600 MG PO TABS
600.0000 mg | ORAL_TABLET | Freq: Four times a day (QID) | ORAL | Status: DC | PRN
  Administered 2019-03-28 (×2): 600 mg via ORAL
  Filled 2019-03-28 (×2): qty 1

## 2019-03-28 MED ORDER — GABAPENTIN 300 MG PO CAPS
300.0000 mg | ORAL_CAPSULE | Freq: Three times a day (TID) | ORAL | Status: DC
  Administered 2019-03-28 – 2019-03-30 (×7): 300 mg via ORAL
  Filled 2019-03-28 (×7): qty 1

## 2019-03-28 NOTE — Progress Notes (Signed)
Occupational Therapy Treatment Patient Details Name: Clayton RalphsBenjamin Baldwin MRN: [id number] DOB: 03/23/1971 Today's Date: 03/28/2019    History of present illness Patient is a 48 year old male with a history of HTN, Alcohol use disorder, Major depression, and hepatic steatosis who presented with EMS after being found down at his home four days after he overdosed on Lorazepam, trazodone and alcohol in a suicide attempt.   OT comments  Pt progressing toward established goals. Pt currently requires minA-modA for UB ADL and maxA for LB ADL. Upon arrival pt saturated in urine, reports he has been like this since the night. Educated pt on importance of communicating this with nsg. Pt requires minA for functional mobility at RW level. Pt with tachy HR, up to 152 with initial standing, returned back to 140s after 1 min sitting, 112 after 3 min sitting; up to 144 following stand-pivot transfer, back to 112 following return to sitting. BP listed below. Pt will continue to benefit from skilled OT services to maximize safety and independence with ADL/IADL and functional mobility. Will continue to follow acutely and progress as tolerated.   Orthostatic Blood Pressure:  Return to sitting after initial standing: 129/91  Following stand-pivot transfer: 118/90  Sitting after 3 min: 103/79     Follow Up Recommendations  Supervision/Assistance - 24 hour(follow up OT at psychiatric hospital)    Equipment Recommendations  None recommended by OT    Recommendations for Other Services      Precautions / Restrictions Precautions Precautions: Fall Precaution Comments: Watch HR, L arm peripheral nerve injury Restrictions Weight Bearing Restrictions: No       Mobility Bed Mobility Overal bed mobility: Needs Assistance Bed Mobility: Supine to Sit     Supine to sit: Min guard;HOB elevated     General bed mobility comments: minguard for safety   Transfers Overall transfer level: Needs  assistance Equipment used: Rolling walker (2 wheeled) Transfers: Sit to/from UGI CorporationStand;Stand Pivot Transfers Sit to Stand: Min assist Stand pivot transfers: Min assist       General transfer comment: vc for safe hand placement;minA to maintain UE support on RW;HR up to 152 with initial standing;up to 144 with stand-pivot    Balance Overall balance assessment: Needs assistance Sitting-balance support: Feet supported Sitting balance-Leahy Scale: Good Sitting balance - Comments: S - min guard for safety seated on EOB   Standing balance support: Single extremity supported;Bilateral upper extremity supported;During functional activity Standing balance-Leahy Scale: Poor Standing balance comment: needs at least one UE support                           ADL either performed or assessed with clinical judgement   ADL Overall ADL's : Needs assistance/impaired             Lower Body Bathing: Maximal assistance;Total assistance Lower Body Bathing Details (indicate cue type and reason): pt reliant on UE support while standing reliant on therapist for totalA for BLE washing Upper Body Dressing : Minimal assistance;Sitting Upper Body Dressing Details (indicate cue type and reason): minA to doff/don gown  Lower Body Dressing: Maximal assistance   Toilet Transfer: Minimal assistance;Stand-pivot Toilet Transfer Details (indicate cue type and reason): stand pivot from EOB to recliner Toileting- Clothing Manipulation and Hygiene: Total assistance Toileting - Clothing Manipulation Details (indicate cue type and reason): pt reliant on UE support in standing, requring total A for posterior pericare     Functional mobility during ADLs: Minimal assistance;Rolling walker General ADL Comments:  pt with improvements in mobility;continued to have elevated HR during transfers HR up to 152 with initial standing, return to sitting HR back down to 140 after 1 min, Hr 112 after     Vision    Vision Assessment?: No apparent visual deficits Additional Comments: reports glasses were lost during initial hospital transfer   Perception     Praxis      Cognition Arousal/Alertness: Awake/alert Behavior During Therapy: Flat affect;Impulsive Overall Cognitive Status: Impaired/Different from baseline Area of Impairment: Safety/judgement;Following commands;Problem solving                       Following Commands: Follows one step commands consistently;Follows multi-step commands with increased time Safety/Judgement: Decreased awareness of deficits   Problem Solving: Slow processing;Decreased initiation;Difficulty sequencing;Requires tactile cues;Requires verbal cues General Comments: slow processing, pt saturated in urine upon arrival, reports this occurred since last night, when asked why he didn't report or call the nurse pt stated his 3rd shift "sitter was asleep and he didnt want to wake her up" present sitter aware         Exercises Exercises: General Upper Extremity General Exercises - Upper Extremity Shoulder Flexion: 10 reps;Left;AAROM;Seated Elbow Flexion: AAROM;10 reps;Seated;Left Elbow Extension: AAROM;Left;10 reps;Seated Wrist Extension: AROM;5 reps;Left;Seated   Shoulder Instructions       General Comments sitter present during session;    Pertinent Vitals/ Pain       Pain Assessment: 0-10 Pain Score: 5  Pain Location: eye Pain Descriptors / Indicators: Sore Pain Intervention(s): Monitored during session  Home Living                                          Prior Functioning/Environment              Frequency  Min 2X/week        Progress Toward Goals  OT Goals(current goals can now be found in the care plan section)  Progress towards OT goals: Progressing toward goals  Acute Rehab OT Goals Patient Stated Goal: continue to regain independence  ADL Goals Pt Will Perform Grooming: with set-up Pt Will Perform  Upper Body Bathing: with set-up Pt Will Perform Lower Body Bathing: with min assist Pt Will Perform Upper Body Dressing: with supervision Pt Will Perform Lower Body Dressing: with min assist Pt Will Transfer to Toilet: with min assist Pt Will Perform Toileting - Clothing Manipulation and hygiene: with mod assist  Plan Discharge plan remains appropriate    Co-evaluation                 AM-PAC OT "6 Clicks" Daily Activity     Outcome Measure   Help from another person eating meals?: A Little Help from another person taking care of personal grooming?: A Little Help from another person toileting, which includes using toliet, bedpan, or urinal?: A Lot Help from another person bathing (including washing, rinsing, drying)?: A Lot Help from another person to put on and taking off regular upper body clothing?: A Little Help from another person to put on and taking off regular lower body clothing?: A Lot 6 Click Score: 15    End of Session Equipment Utilized During Treatment: Gait belt;Rolling walker  OT Visit Diagnosis: Muscle weakness (generalized) (M62.81);History of falling (Z91.81);Unsteadiness on feet (R26.81)   Activity Tolerance Patient tolerated treatment well   Patient Left in chair;with call  bell/phone within reach;with nursing/sitter in room   Nurse Communication Mobility status        Time: 2683-4196 OT Time Calculation (min): 24 min  Charges: OT General Charges $OT Visit: 1 Visit OT Treatments $Self Care/Home Management : 8-22 mins $Therapeutic Exercise: 8-22 mins  Dorinda Hill OTR/L Acute Rehabilitation Services Office: [place] 03/28/2019, 12:49 PM

## 2019-03-28 NOTE — TOC Progression Note (Signed)
Transition of Care [facility]) - Progression Note    Patient Details  Name: Masin Shatto MRN: [id number] Date of Birth: 1970-09-09  Transition of Care [facility] CM/SW [place], [place] Phone Number: 03/28/2019, 12:23 PM  Clinical Narrative:    12:28pm- called [place]- they will call back  12:18pm Called [facility] -unable to reach intake coordinator  10:26 am called [facility]  -unable to reach intake coordinator    Barriers to Discharge: Continued Medical Work up  Expected Discharge Plan and Services         Living arrangements for the past 2 months: Single Family Home Expected Discharge Date: 03/20/19                                     Social Determinants of Health (SDOH) Interventions    Readmission Risk Interventions No flowsheet data found.

## 2019-03-28 NOTE — TOC Progression Note (Signed)
Transition of Care [facility]) - Progression Note    Patient Details  Name: Clayton Baldwin MRN: [id number] Date of Birth: 07-04-1971  Transition of Care [facility]) CM/SW [place], [place] Phone Number: 03/28/2019, 1:36 PM  Clinical Narrative:     Selena Lesser [facility] requested to have CK labs drawn- RN updated.  Thurmond Butts, MSW, LCSWA Clinical Social Worker [phone]     Barriers to Discharge: Continued Medical Work up  Expected Discharge Plan and Services         Living arrangements for the past 2 months: Single Family Home Expected Discharge Date: 03/20/19                                     Social Determinants of Health (SDOH) Interventions    Readmission Risk Interventions No flowsheet data found.

## 2019-03-28 NOTE — Progress Notes (Signed)
Subjective: Patient seen at the bedside on rounds this morning.  Patient feels fine today but is complaining of left shoulder pain that radiates to the forearm on his left side.  Also still complaining of numbness in the ulnar distribution on his left hand.  We explained to the patient this is likely is a consequence of his spinal pathology and we can try some medicines to see if it helps.  Patient is in agreement.  Objective:  Vital signs in last 24 hours: Vitals:   03/27/19 1500 03/27/19 1600 03/27/19 1939 03/28/19 0353  BP: 107/79 102/70 (!) 134/97 129/86  Pulse:  91 (!) 104 86  Resp: 18 13 18 14   Temp:   98.3 F (36.8 C) 98.2 F (36.8 C)  TempSrc:   Oral Oral  SpO2:  99% 98% 100%  Weight:      Height:       Physical Exam: General: Resting in bed, NAD HEENT: Right eye bruising & scabbing improving CV: RRR, normal S1-S2 no murmurs rubs or gallops appreciated PULM: Clear to auscultation bilaterally MSK: Left arm 0/5 flexion and 3/5 extension NEURO: Alert and oriented x4.  Decreased sensation in the left hand in the ulnar distribution.  Burning sensation in the left shoulder that radiates to left lateral forearm.  Assessment/Plan:  Principal Problem:   Severe episode of recurrent major depressive disorder, without psychotic features ([place]) Active Problems:   Rhabdomyolysis   Suicide attempt ([place])   Thrombocytopenia ([place])   AKI (acute kidney injury) ([place])   Hematoma of eyelid   Alcohol use disorder, severe, dependence ([place])   Subconjunctival hemorrhage, traumatic, right  In summary, Mr. Neisen is a 48 year old male with a history of HTN, polysubstance use disorder, depression and hepatic steatosis who presented to the ER after being found down at home for unknown known amount of time. He is suspected to have been down for 4 days after taking Lorazepam, Trazodone and alcohol as an attempted suicide. Pt was admitted for rhabdomyolysis with CK greater than 8000, L eye injury,  and thrombocytopenia of 65, which have all improved with IV fluids. At this point in time, the patient is medically stable for discharge.   #Depression with SI #Suicide Attempt #Polysubstance Use Disorder - Psychiatry was consulted and recommends inpatient psych placement.  Case management is working on finding the patient in bed. 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  - Start gabapentin 300 mg 3 times daily - Ibuprofen 600 mg q6hr PRN - Tylenol 650 mg q6hrs PRN  #Rhabdomyolysis: Resolved.On admission CK was 8252, and downtrended to 1498.  No longer checking CK. - Encourage PO intake   #LUE Swelling #LUE Weakness:  LUE Doppler study negative for DVT. MRI of his cervical spine, L shoulder and chest were performed. MRI C-spine was notable for degenerative spinal stenosis and by for minimal impingement from C3-4, C6-7 and flattening of the spinal cord at C5-7.  This is likely causing his LUE weakness. MRI L shoulder notable for superior subscapularis partial tear and small glenohumeral joint effusion. MRI chest notable for rhabdo of chest and L side of the neck.  Neurology & Neurosurgery as seen the patient and reccomendations are below. - PT/OT recommend further [facility] at [facility]   #FEN/GI - Diet: Regular - Fluids: none   #Code: Full   #Dispo: The patient is currently medically stable for discharge. Patient needs inpatient psych placement.   Earlene Plater, MD  Internal Medicine, PGY1 Pager: [phone]  03/28/2019,6:31 AM

## 2019-03-29 LAB — MAGNESIUM: Magnesium: 1.9 mg/dL (ref 1.7–2.4)

## 2019-03-29 LAB — GLUCOSE, CAPILLARY: Glucose-Capillary: 105 mg/dL — ABNORMAL HIGH (ref 70–99)

## 2019-03-29 NOTE — TOC Progression Note (Signed)
Transition of Care [facility]) - Progression Note    Patient Details  Name: Clayton Baldwin MRN: [id number] Date of Birth: [birth year]  Transition of Care [facility]) CM/SW [place], [place] Phone Number: 03/29/2019, 1:55 PM  Clinical Narrative:     No [facility] beds available- CSW will continue to follow and assisit with discharge planning.  Thurmond Butts, MSW, LCSWA Clinical Social Worker [phone]     Barriers to Discharge: Continued Medical Work up  Expected Discharge Plan and Services         Living arrangements for the past 2 months: Single Family Home Expected Discharge Date: 03/20/19                                     Social Determinants of Health (SDOH) Interventions    Readmission Risk Interventions No flowsheet data found.

## 2019-03-29 NOTE — Plan of Care (Signed)
  Problem: Clinical Measurements: Goal: Cardiovascular complication will be avoided Outcome: Progressing   Problem: Nutrition: Goal: Adequate nutrition will be maintained Outcome: Progressing   Problem: Elimination: Goal: Will not experience complications related to bowel motility Outcome: Progressing Goal: Will not experience complications related to urinary retention Outcome: Progressing

## 2019-03-29 NOTE — Progress Notes (Signed)
   Subjective: Patient seen at the bedside on rounds this morning.  Patient says the pain in his left arm has moved more into his elbow.  The Tylenol has been helpful.  No complaints at this time.  Objective:  Vital signs in last 24 hours: Vitals:   03/28/19 1100 03/28/19 1823 03/28/19 1900 03/29/19 0523  BP: 126/88 114/83 103/65 93/62  Pulse:   77 82  Resp: 14  11 16   Temp: 98.3 F (36.8 C)  98.3 F (36.8 C) 97.9 F (36.6 C)  TempSrc: Oral  Oral Oral  SpO2:   99% 99%  Weight:    86.8 kg  Height:       Physical Exam: General: Resting in bed, NAD HEENT: Right eye bruising & scabbing improving CV: RRR, normal S1-S2 no murmurs rubs or gallops appreciated PULM: Clear to auscultation bilaterally MSK: Left arm 0/5 flexion and 3/5 extension. No pain with passive flexion and extension of left arm. NEURO: Alert and oriented x4.  Decreased sensation in the left hand in the ulnar distribution. Burning sensation in the left shoulder that radiates to left lateral forearm.  Assessment/Plan:  Principal Problem:   Severe episode of recurrent major depressive disorder, without psychotic features ([place]) Active Problems:   Rhabdomyolysis   Suicide attempt ([place])   Thrombocytopenia ([place])   AKI (acute kidney injury) ([place])   Hematoma of eyelid   Alcohol use disorder, severe, dependence ([place])   Subconjunctival hemorrhage, traumatic, right  In summary, Clayton Baldwin is a 48 year old male with a history of HTN, polysubstance use disorder, depression and hepatic steatosis who presented to the ER after being found down at home for unknown known amount of time. He is suspected to have been down for 4 days after taking Lorazepam, Trazodone and alcohol as an attempted suicide. Pt was admitted for rhabdomyolysis with CK greater than 8000, L eye injury, and thrombocytopenia of 65, which have all resolved with IV fluids. At this point in time, the patient is medically stable for discharge.   #Depression with  SI #Suicide Attempt #Polysubstance Use Disorder - Psychiatry was consulted and recommends inpatient psych placement. Case management is working on finding the patient a bed. 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  - Gabapentin 300 mg 3 times daily - Ibuprofen 600 mg q6hr PRN - Tylenol 650 mg q6hrs PRN  #Rhabdomyolysis: Resolved.On admission CK was 8252, and downtrended to 92. Will not trend anymore.   #LUE Swelling #LUE Weakness:  LUE Doppler study negative for DVT. MRI of his cervical spine, L shoulder and chest were performed. MRI C-spine was notable for degenerative spinal stenosis and by for minimal impingement from C3-4, C6-7 and flattening of the spinal cord at C5-7.  This is likely causing his LUE weakness. MRI L shoulder notable for superior subscapularis partial tear and small glenohumeral joint effusion. MRI chest notable for rhabdo of chest and L side of the neck.  Neurology & Neurosurgery as seen the patient and reccomendations are below. - PT/OT recommend further therapy at [facility] [place]   #FEN/GI - Diet: Regular - Fluids: none   #Code: Full   #Dispo: The patient is currently medically stable for discharge. Patient needs inpatient psych placement.   Earlene Plater, MD Internal Medicine, PGY1 Pager: [phone]  03/29/2019,6:24 AM

## 2019-03-29 NOTE — Discharge Summary (Addendum)
Name: Clayton Baldwin MRN: [id number] DOB: 1970-12-22 48 y.o. PCP: Patient, No Pcp Per  Date of Admission: 03/17/2019  3:13 PM Date of Discharge:  Attending Physician: Aldine Contes, MD  Discharge Diagnosis: 1. Suicide Attempt 2. Rhabdomyolysis  3. Cervical Radiculopathy  4. Thrombocytopenia  Discharge Medications: Allergies as of 03/31/2019      Reactions   Grass Extracts [gramineae Pollens] Itching, Other (See Comments)   Wheezing, also      Medication List    STOP taking these medications   olmesartan 40 MG tablet Commonly known as: BENICAR   propranolol 80 MG 24 hr capsule Commonly known as: INNOPRAN XL   traZODone 150 MG tablet Commonly known as: DESYREL     TAKE these medications   allopurinol 100 MG tablet Commonly known as: ZYLOPRIM Take 1 tablet (100 mg total) by mouth daily.   amLODipine 10 MG tablet Commonly known as: NORVASC Take 1 tablet (10 mg total) by mouth daily.   escitalopram 10 MG tablet Commonly known as: LEXAPRO Take 1 tablet (10 mg total) by mouth daily. Start taking on: April 01, 2019 What changed:   medication strength  how much to take   gabapentin 300 MG capsule Commonly known as: NEURONTIN Take 2 capsules (600 mg total) by mouth 3 (three) times daily.   ibuprofen 200 MG tablet Commonly known as: ADVIL Take 400 mg by mouth every 6 (six) hours as needed for headache or mild pain.   KLS Aller-Tec 10 MG tablet Generic drug: cetirizine Take 10 mg by mouth daily.   lisinopril 5 MG tablet Commonly known as: ZESTRIL Take 1 tablet (5 mg total) by mouth daily.   NON FORMULARY Take 1 capsule by mouth See admin instructions. [employer] Adult 50+ Softgels- Take 1 softgel by mouth once a day            Durable Medical Equipment  (From admission, onward)         Start     Ordered   03/31/19 1437  For home use only DME Walker rolling  [facility])  Once    Question:  Patient needs a walker to treat  with the following condition  Answer:  Weakness   03/31/19 1437         Disposition and follow-up:   Clayton Baldwin was discharged from [facility] in [place] condition. At the hospital follow up visit please address:  1. Suicide Attempt: Patient presented after being found down for 4 days after taking lorazepam, trazodone and alcohol as a suicide attempt. Psychiatry was consulted during his stay and recommended the patient to follow-up in the outpatient setting.  - Needs help coordinating outpatient psychiatry follow-up - Please help provide resources for substance abuse counseling  2. Rhabdomyolysis: On admission CK was 8252 and the patient received IV fluids. The CK down trended every day the patient was here and IV fluids were discontinued.  3. Cervical Radiculopathy:  MRI C-spinewas notable for degenerative spinal stenosis and by for minimal impingement from C3-4, C6-7 and flattening of the spinal cord at C5-7.This is likely causing his LUE weakness. Neurosurgery and neurology were consulted during this admission and recommended that he receive follow-up in the outpatient setting. - Patient needs neurosurgery appointment follow-up year referral has been placed with Dr. Arnoldo Morale.  4. Thrombocytopenia: Platelets were at 65 on admission and up trended to normal limits prior to discharge. We suspect this is secondary to excessive alcohol use.  5.  Labs / imaging  needed at time of follow-up: none  6.  Pending labs/ test needing follow-up: none  Follow-up Appointments:   [facility] [address] [place], [place] [address] Hospital follow-up appointment scheduled for September 24 at 9:45 [facility]  Hospital Course by problem list: 1.  Suicide attempt 2.  Rhabdomyolysis 3.  Cervical radiculopathy 4. Thrombocytopenia  In summary, Clayton Baldwin isa 48 year old male with a history of HTN, polysubstance use disorder,  depression and hepatic steatosis who presented to the ER after being found down at home for unknown known amount of time. He is suspected to have been down for 4 days after taking Lorazepam,Trazodone and alcohol as anattempted suicide. Pt was admitted forrhabdomyolysis, with CK greater than 8000, L eye injury,andthrombocytopenia of65, were treated with IV fluids. Over the course of his stay, the patient's CK returned to normal, his thrombocytopenia resolved.  We suspect his thrombocytopenia was due to excessive alcohol use. The patient's eye injury was treated with erythromycin ointment for 5 days per ophthalmologist recommendation. - Patient will need neurosurgical follow-up in the outpatient setting. Referral placed to Dr. Lovell SheehanJenkins - Needs outpatient psychiatry follow-up - PCP appointment scheduled on 9/24 at 9:45 AM [facility].   Discharge Vitals:   BP 108/72 (BP Location: Right Arm)   Pulse 82   Temp 97.7 F (36.5 C) (Oral)   Resp 16   Ht 6' (1.829 m)   Wt 86.8 kg   SpO2 99%   BMI 25.95 kg/m   Pertinent Labs, Studies, and Procedures:   CT Head, Maxillofacial, Cervical Spine, Chest, Abdomen & Pelvis, Lumbar Spine: IMPRESSION: 1. No acute intracranial process. 2. No acute cervical spine fracture or dislocation. 3. Right frontal scalp soft tissue swelling. 4. No acute maxillofacial fracture. 5. No acute intra-abdominal or pelvic injury. 6. Hepatic steatosis. 7. Horseshoe kidney. 8. Colonic diverticulosis without CT evidence for diverticulitis. 9. There is a chronic appearing L3 transverse process fracture on the right. 10. Focus of gas within the urinary bladder. Correlation with urinalysis and patient history is recommended. This may be secondary to prior instrumentation or an infection with a gas-forming Organism.  MRI Brain: 1. No acute intracranial finding. 2. Brain atrophy, especially severe in the cerebellum and likely related to  history of alcohol abuse.  MRI Cervical Spine: IMPRESSION: 1. Strain of left levator scapulae and trapezius. Right-sided supraspinatus strain. 2. 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  MRI L Shoulder: IMPRESSION: 1. Diffuse muscular changes seen throughout the shoulder likely consistent with acute mild-to-moderate rhabdomyolysis. No areas of necrosis however noted. 2. Supraspinatus and infraspinatus tendinosis 3. Superior subscapularis partial interstitial tear. 4. Small glenohumeral joint effusion  MRI Chest: IMPRESSION: Extensive rhabdomyolysis of the chest and of the left side of the neck as described above.  U/s Upper Extremity Venous Duplex: Right: No evidence of thrombosis in the subclavian.   Left: No evidence of deep vein thrombosis in the upper extremity. No evidence of superficial vein thrombosis in the upper extremity. Interstitial edema noted throughout.  Discharge Instructions:   Signed: Kirt BoysAndrew Makaela Cando, MD Internal Medicine, PGY1 Pager: [phone]  03/31/2019,2:59 PM

## 2019-03-29 NOTE — Progress Notes (Signed)
Physical Therapy Treatment Patient Details Name: Clayton Baldwin MRN: [id number] DOB: 05/24/1971 Today's Date: 03/29/2019    History of Present Illness Patient is a 48 year old male with a history of HTN, Alcohol use disorder, Major depression, and hepatic steatosis who presented with EMS after being found down at his home four days after he overdosed on Lorazepam, trazodone and alcohol in a suicide attempt.    PT Comments    Pt walked 50 feet today with RW - HR up to 144bpm with standing rest breaks every 10 feet.  Pt did exercises - focus on quad strengthening to help with safety in standing.  Pt cooperative. PT to continue to work with pt while on acute.  I encouraged sitter to help pt with short walks or pt march EOB every time he gets up to slowly increase his activity level.    Follow Up Recommendations  ([facility])     Equipment Recommendations  (RW versus cane - can be decided at next venue of care)    Recommendations for Other Services       Precautions / Restrictions Precautions Precautions: Fall Precaution Comments: Watch HR, L arm peripheral nerve injury Restrictions Weight Bearing Restrictions: No    Mobility  Bed Mobility               General bed mobility comments: pt sitting in chair when i arrived  Transfers Overall transfer level: Needs assistance Equipment used: Rolling walker (2 wheeled)   Sit to Stand: Min guard         General transfer comment: vc for safe hand placement.  Pt needed assist putting left hand on RW and holding it there  Ambulation/Gait Ambulation/Gait assistance: Min assist Gait Distance (Feet): 50 Feet Assistive device: Rolling walker (2 wheeled) Gait Pattern/deviations: Decreased stride length;Wide base of support;Step-through pattern     General Gait Details: Pt walked with RW (I held left hand on RW).  I had pt stop every 10 feet and take standing rest break.  pts legs were wobbly in standing.  Pt needed  assist to steer the RW   Stairs             Wheelchair Mobility    Modified Rankin (Stroke Patients Only)       Balance               Standing balance comment: needs at least one UE support                            Cognition Arousal/Alertness: Awake/alert Behavior During Therapy: Flat affect Overall Cognitive Status: Impaired/Different from baseline                                 General Comments: Pt with sitting present.  pt agreeable to OOB.  pt slow to process but following directions with extra time      Exercises Other Exercises Other Exercises: Pt sitting - toe taps x 10.  Pt did long arc quads - cues to get knees all the way straight and to slow down.  Hard both legs to get straight.  pt with jerky movements Other Exercises: Pt reclined - SLR - harder on left, cues to lock knees straight.  pt did ankle pumps -cues to focus on DF and ankle circles - both directions.  10 reps for all.    General  Comments General comments (skin integrity, edema, etc.): Pts HR 100 bpm at rest and 144bpm after walking - slow walk and stopping every 10 feet to take several deep breaths.      Pertinent Vitals/Pain Pain Score: 4  Pain Location: left elbow Pain Descriptors / Indicators: Sore;Aching Pain Intervention(s): Limited activity within patient's tolerance;Monitored during session;Repositioned    Home Living                      Prior Function            PT Goals (current goals can now be found in the care plan section) Acute Rehab PT Goals Patient Stated Goal: continue to regain independence  Progress towards PT goals: Progressing toward goals    Frequency    Min 3X/week      PT Plan Current plan remains appropriate    Co-evaluation              AM-PAC PT "6 Clicks" Mobility   Outcome Measure  Help needed turning from your back to your side while in a flat bed without using bedrails?: A Little Help needed  moving from lying on your back to sitting on the side of a flat bed without using bedrails?: A Little Help needed moving to and from a bed to a chair (including a wheelchair)?: A Little Help needed standing up from a chair using your arms (e.g., wheelchair or bedside chair)?: A Little Help needed to walk in hospital room?: A Little Help needed climbing 3-5 steps with a railing? : Total 6 Click Score: 16    End of Session Equipment Utilized During Treatment: Gait belt Activity Tolerance: Patient limited by fatigue Patient left: in chair;with nursing/sitter in room;with call bell/phone within reach Nurse Communication: Mobility status PT Visit Diagnosis: Other abnormalities of gait and mobility (R26.89);Muscle weakness (generalized) (M62.81);Adult, failure to thrive (R62.7)     Time: 1320-1340 PT Time Calculation (min) (ACUTE ONLY): 20 min  Charges:  $Gait Training: 8-22 mins                     03/29/2019   Ranae PalmsElizabeth Davaun Quintela, PT    Judson RochHildreth, Ulas Zuercher Gardner 03/29/2019, 1:54 PM

## 2019-03-30 LAB — GLUCOSE, CAPILLARY: Glucose-Capillary: 137 mg/dL — ABNORMAL HIGH (ref 70–99)

## 2019-03-30 MED ORDER — GABAPENTIN 300 MG PO CAPS
600.0000 mg | ORAL_CAPSULE | Freq: Three times a day (TID) | ORAL | Status: DC
  Administered 2019-03-30 – 2019-03-31 (×3): 600 mg via ORAL
  Filled 2019-03-30 (×3): qty 2

## 2019-03-30 NOTE — Progress Notes (Signed)
   Subjective: Patient seen at the bedside on rounds this morning.  He is still complaining of left arm pain and would like to go up on his gabapentin. Patient has no other complaints at this time.  Understands he is still waiting placement for an inpatient psych hospital.  Objective:  Vital signs in last 24 hours: Vitals:   03/29/19 1640 03/29/19 2012 03/29/19 2341 03/30/19 0501  BP:  117/78  111/74  Pulse:  77  68  Resp:    13  Temp: 98.5 F (36.9 C) 98.5 F (36.9 C) 98.5 F (36.9 C) 98.6 F (37 C)  TempSrc:  Oral Oral Oral  SpO2:  99%  99%  Weight:    88.4 kg  Height:       Physical Exam: General: Resting in bed, NAD HEENT: Right eye bruising & scabbing improving CV: RRR, normal S1-S2 no murmurs rubs or gallops appreciated PULM: Clear to auscultation bilaterally MSK: Left arm 0/5 flexion and 3/5 extension. No pain with passive flexion and extension of left arm. NEURO: Alert and oriented x4. Decreased sensation in the left hand in the ulnar distribution. Burning sensation in the left shoulder that radiates to left lateral forearm.  Assessment/Plan:  Principal Problem:   Severe episode of recurrent major depressive disorder, without psychotic features ([place]) Active Problems:   Rhabdomyolysis   Suicide attempt ([place])   Thrombocytopenia ([place])   AKI (acute kidney injury) ([place])   Hematoma of eyelid   Alcohol use disorder, severe, dependence ([place])   Subconjunctival hemorrhage, traumatic, right  In summary, Clayton Baldwin is a 48 year old male with a history of HTN, polysubstance use disorder, depression and hepatic steatosis who presented to the ER after being found down at home for unknown known amount of time. He is suspected to have been down for 4 days after taking Lorazepam, Trazodone and alcohol as an attempted suicide. Pt was admitted for rhabdomyolysis with CK greater than 8000, L eye injury, and thrombocytopenia of 65, which have all resolved with IV fluids. At this point in  time, the patient is medically stable for discharge.   #Depression with SI #Suicide Attempt #Polysubstance Use Disorder - Psychiatry was consulted and recommends inpatient psych placement. Case management is working on finding the patient a bed. 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  - Gabapentin 600 mg 3 times daily - Ibuprofen 600 mg q6hr PRN - Tylenol 650 mg q6hrs PRN  #Rhabdomyolysis: Resolved.On admission CK was 8252, and downtrended to 92. Will not trend anymore.   #LUE Swelling #LUE Weakness:  LUE Doppler study negative for DVT. MRI of his cervical spine, L shoulder and chest were performed. MRI C-spine was notable for degenerative spinal stenosis and by for minimal impingement from C3-4, C6-7 and flattening of the spinal cord at C5-7.  This is likely causing his LUE weakness. MRI L shoulder notable for superior subscapularis partial tear and small glenohumeral joint effusion. MRI chest notable for rhabdo of chest and L side of the neck.  Neurology & Neurosurgery as seen the patient and reccomendations are below. - PT/OT recommend [facility] at [facility]   #FEN/GI - Diet: Regular - Fluids: none   #Code: Full   #Dispo: The patient is currently medically stable for discharge. Patient needs inpatient psych placement.   Earlene Plater, MD Internal Medicine, PGY1 Pager: [phone]  03/30/2019,7:29 AM

## 2019-03-30 NOTE — Plan of Care (Signed)
  Problem: Clinical Measurements: Goal: Will remain free from infection Outcome: Progressing Goal: Respiratory complications will improve Outcome: Progressing Goal: Cardiovascular complication will be avoided Outcome: Progressing   Problem: Activity: Goal: Risk for activity intolerance will decrease Outcome: Progressing   

## 2019-03-30 NOTE — TOC Progression Note (Signed)
Transition of Care [facility]) - Progression Note    Patient Details  Name: Clayton Baldwin MRN: [id number] Date of Birth: May 19, 1971  Transition of Care [facility]) CM/SW [place], [place] Phone Number: 03/30/2019, 3:28 PM  Clinical Narrative:     Patient has no bed offer.  9:48am- [facility] states they will contact CSW 1:48pm-F/u [facility] has no beds  1:50pm- [facility]- has no beds   Contacted [facility]- they have no availability   Faxed referrals to [place], [employer], [employer] and [place] awaiting response    Thurmond Butts, MSW, [facility] Clinical Social Worker [phone]      Barriers to Discharge: Continued Medical Work up  Expected Discharge Plan and [place] arrangements for the past 2 months: Single Family Home Expected Discharge Date: 03/20/19                                     Social Determinants of Health (SDOH) Interventions    Readmission Risk Interventions No flowsheet data found.

## 2019-03-31 DIAGNOSIS — F331 Major depressive disorder, recurrent, moderate: Secondary | ICD-10-CM

## 2019-03-31 DIAGNOSIS — Z79899 Other long term (current) drug therapy: Secondary | ICD-10-CM

## 2019-03-31 DIAGNOSIS — M5412 Radiculopathy, cervical region: Secondary | ICD-10-CM

## 2019-03-31 MED ORDER — ESCITALOPRAM OXALATE 10 MG PO TABS: 10.0000 mg | ORAL_TABLET | Freq: Every day | ORAL | 2 refills | Status: AC

## 2019-03-31 MED ORDER — SENNA 8.6 MG PO TABS: 1.0000 | ORAL_TABLET | Freq: Every day | ORAL | Status: DC | PRN

## 2019-03-31 MED ORDER — POLYETHYLENE GLYCOL 3350 17 G PO PACK: 17.0000 g | PACK | Freq: Two times a day (BID) | ORAL | Status: DC | PRN

## 2019-03-31 MED ORDER — AMLODIPINE BESYLATE 10 MG PO TABS: 10.0000 mg | ORAL_TABLET | Freq: Every day | ORAL | 2 refills | Status: AC

## 2019-03-31 MED ORDER — ALLOPURINOL 100 MG PO TABS: 100.0000 mg | ORAL_TABLET | Freq: Every day | ORAL | 2 refills | Status: AC

## 2019-03-31 MED ORDER — LISINOPRIL 5 MG PO TABS: 5.0000 mg | ORAL_TABLET | Freq: Every day | ORAL | 3 refills | Status: AC

## 2019-03-31 MED ORDER — GABAPENTIN 300 MG PO CAPS: 600.0000 mg | ORAL_CAPSULE | Freq: Three times a day (TID) | ORAL | 2 refills | Status: AC

## 2019-03-31 MED FILL — ALLOPURINOL 100 MG TABLET: 100 | 30 days supply | Qty: 30 | Fill #0

## 2019-03-31 MED FILL — ESCITALOPRAM 10 MG TABLET: 10 | 30 days supply | Qty: 30 | Fill #0

## 2019-03-31 MED FILL — GABAPENTIN 300 MG CAPSULE: 300 | 30 days supply | Qty: 180 | Fill #0

## 2019-03-31 MED FILL — AMLODIPINE BESYLATE 10 MG T: 10 | 30 days supply | Qty: 30 | Fill #0

## 2019-03-31 MED FILL — LISINOPRIL 5 MG TABLET: 5 | 30 days supply | Qty: 30 | Fill #0

## 2019-03-31 NOTE — Progress Notes (Signed)
Patient with increase MEWs score with increase HR. Not a new episode for patient as HR frequently increases with activity physicians are aware. Patient with no complaints other than constipation.

## 2019-03-31 NOTE — Progress Notes (Signed)
Gilles Trimpe to be discharged Home  per MD order. Discussed prescriptions and follow up appointments with the patient. Prescriptions given to patient; medication list explained in detail. Patient verbalized understanding.  Skin clean, dry and intact without evidence of skin break down, no evidence of skin tears noted. IV catheter discontinued intact. Site without signs and symptoms of complications. Dressing and pressure applied. Pt denies pain at the site currently. No complaints noted.  Patient free of lines, drains, and wounds.   An After Visit Summary (AVS) was printed and given to the patient. Patient escorted via wheelchair, and discharged home via private auto.  Shela Commons, RN

## 2019-03-31 NOTE — TOC Transition Note (Signed)
Transition of Care [facility]) - CM/SW Discharge Note   Patient Details  Name: Clayton Baldwin MRN: [id number] Date of Birth: April 13, 1971  Transition of Care [facility]) CM/SW Contact:  Bartholomew Crews, RN Phone Number: [phone] 03/31/2019, 3:33 PM   Clinical Narrative:    Patient to transition home today. Match letter created with override request sent to [place]. Hospital follow up appointment scheduled at [place] for 10/6 at 8:50 am. Transportation arranged through [facility] - waiver provided to patient who signed and the waiver was placed in shadow chart. Patient also requested clothes - shirt and shorts found in SW closet. No further transition of care needs identified.   Final next level of care: Home/Self Care Barriers to Discharge: No Barriers Identified   Patient Goals and CMS Choice Patient states their goals for this hospitalization and ongoing recovery are:: return home CMS Medicare.gov Compare Post Acute Care list provided to:: Patient Choice offered to / list presented to : NA  Discharge Placement                       Discharge Plan and Services                DME Arranged: N/A DME Agency: NA       HH Arranged: NA HH Agency: NA        Social Determinants of Health (SDOH) Interventions     Readmission Risk Interventions No flowsheet data found.

## 2019-03-31 NOTE — Progress Notes (Signed)
Physical Therapy Treatment Patient Details Name: Clayton Baldwin MRN: [id number] DOB: [birth year] Today's Date: 03/31/2019    History of Present Illness Patient is a 48 year old male with a history of HTN, Alcohol use disorder, Major depression, and hepatic steatosis who presented with EMS after being found down at his home four days after he overdosed on Lorazepam, trazodone and alcohol in a suicide attempt.    PT Comments    Pt was seen for mobility today, with gait on RW resulting in some difficulty with LUE support.  He was finally just able to walk with HHA and gait belt, but was noted to have a large increase in HR to 158 quickly.  Stopped on the hallway and then upon return to his room was 141.  Will continue to monitor his fluctuations in pulses, to work toward a more reasonable control of his activity vs his systemic tolerances for moving.  See acutely for balance and to make adjustments as needed on AD.   Follow Up Recommendations  (behavioral health stay)     Equipment Recommendations  Rolling walker with 5" wheels(platform attachment LUE)    Recommendations for Other Services       Precautions / Restrictions Precautions Precautions: Fall Precaution Comments: Watch HR, L arm peripheral nerve injury Restrictions Weight Bearing Restrictions: No    Mobility  Bed Mobility Overal bed mobility: Needs Assistance Bed Mobility: Supine to Sit Rolling: Min assist         General bed mobility comments: sat up in chair at end of session  Transfers Overall transfer level: Needs assistance Equipment used: Rolling walker (2 wheeled);1 person hand held assist Transfers: Sit to/from Stand Sit to Stand: Min assist         General transfer comment: PT assisted LUE and pt used bed then walker to transfer to stand, belt for powering up  Ambulation/Gait Ambulation/Gait assistance: Min guard;Min assist Gait Distance (Feet): 100 Feet Assistive device: Rolling walker (2  wheeled);1 person hand held assist Gait Pattern/deviations: Step-through pattern;Wide base of support;Decreased stride length;Decreased weight shift to left(L foot in ER) Gait velocity: reduced Gait velocity interpretation: <1.8 ft/sec, indicate of risk for recurrent falls General Gait Details: pt could not use RW very effectively and would be better with platform attachment, but walked with no AD, noted pulse 158 by end of trip out from room.  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[place] (Stroke Patients Only)       Balance Overall balance assessment: Needs assistance Sitting-balance support: Feet supported Sitting balance-Leahy Scale: Good     Standing balance support: During functional activity;Bilateral upper extremity supported Standing balance-Leahy Scale: Fair Standing balance comment: pt stands up with walker but can stand with one hand on wall rail or light LUE support                            Cognition Arousal/Alertness: Awake/alert Behavior During Therapy: Flat affect Overall Cognitive Status: Impaired/Different from baseline Area of Impairment: Safety/judgement;Following commands;Problem solving                       Following Commands: Follows one step commands consistently;Follows multi-step commands with increased time Safety/Judgement: Decreased awareness of deficits   Problem Solving: Slow processing;Decreased initiation;Requires verbal cues General Comments: pt slow to initiate; pleasant, but  flat      Exercises      General Comments General comments (skin integrity, edema, etc.): pt had fluctuation of pulses from 118 at rest to 158 half way through walk and 141 end of walk      Pertinent Vitals/Pain Pain Assessment: No/denies pain    Home Living                      Prior Function            PT Goals (current goals can now be found in the  care plan section) Acute Rehab PT Goals Patient Stated Goal: increase independent gait Progress towards PT goals: Progressing toward goals    Frequency    Min 3X/week      PT Plan Current plan remains appropriate    Co-evaluation              AM-PAC PT "6 Clicks" Mobility   Outcome Measure  Help needed turning from your back to your side while in a flat bed without using bedrails?: A Little Help needed moving from lying on your back to sitting on the side of a flat bed without using bedrails?: A Little Help needed moving to and from a bed to a chair (including a wheelchair)?: A Little Help needed standing up from a chair using your arms (e.g., wheelchair or bedside chair)?: A Little Help needed to walk in hospital room?: A Little Help needed climbing 3-5 steps with a railing? : A Lot 6 Click Score: 17    End of Session Equipment Utilized During Treatment: Gait belt Activity Tolerance: Patient limited by fatigue Patient left: in chair;with nursing/sitter in room;with call bell/phone within reach Nurse Communication: Mobility status PT Visit Diagnosis: Other abnormalities of gait and mobility (R26.89);Muscle weakness (generalized) (M62.81);Adult, failure to thrive (R62.7)     Time: [id number] PT Time Calculation (min) (ACUTE ONLY): 25 min  Charges:  $Gait Training: 8-22 mins $Therapeutic Activity: 8-22 mins                    Ivar DrapeRuth E Billiejean Schimek 03/31/2019, 4:06 PM   Samul Dadauth Esraa Seres, PT MS Acute Rehab Dept. Number: [facility] [id number] and [facility] [phone]

## 2019-03-31 NOTE — Progress Notes (Signed)
   Subjective: Patient seen at the bedside on rounds this morning.  Patient says that he feels constipated, noting that he had 1 small bowel movement yesterday.  No other complaints at this time.  Patient understands he is still waiting psych placement.  Objective:  Vital signs in last 24 hours: Vitals:   03/30/19 2014 03/30/19 2251 03/31/19 0500 03/31/19 0603  BP: (!) 129/94 (!) 128/91  123/74  Pulse: (!) 51 82  (!) 110  Resp: 15 18    Temp: 98.1 F (36.7 C) 99.3 F (37.4 C)  99.6 F (37.6 C)  TempSrc: Oral Oral  Oral  SpO2: 100% 100%  98%  Weight:   88.4 kg   Height:       Physical Exam: General: Resting in bed, NAD HEENT: Right eye bruising & scabbing improving CV: RRR, normal S1-S2 no murmurs rubs or gallops appreciated PULM: Clear to auscultation bilaterally MSK: Left arm 1/5 flexion and 3/5 extension.  NEURO: Alert and oriented x4. Decreased sensation in the left hand in the ulnar distribution.  Psych: Normal affect   Assessment/Plan:  Principal Problem:   Severe episode of recurrent major depressive disorder, without psychotic features ([place]) Active Problems:   Rhabdomyolysis   Suicide attempt ([place])   Thrombocytopenia ([place])   AKI (acute kidney injury) ([place])   Hematoma of eyelid   Alcohol use disorder, severe, dependence ([place])   Subconjunctival hemorrhage, traumatic, right  In summary, Clayton Baldwin is a 48 year old male with a history of HTN, polysubstance use disorder, depression and hepatic steatosis who presented to the ER after being found down at home for unknown known amount of time. He is suspected to have been down for 4 days after taking Lorazepam, Trazodone and alcohol as an attempted suicide. Pt was admitted for rhabdomyolysis with CK greater than 8000, L eye injury, and thrombocytopenia of 65, which have all resolved with IV fluids. At this point in time, the patient is medically stable for discharge.   #Depression with SI #Suicide Attempt #Polysubstance  Use Disorder: - Psychiatry was consulted and recommends inpatient psych placement. Case management is working on finding the patient a bed. - Reconsulting psychiatry given the fact that the patient's affect is improved and denied having SI.  It may be appropriate for him to follow-up with psychiatry in the outpatient setting - Continue Lexapro 10 mg daily   #L shoulder pain: Likely neuropathic pain secondary to spinal pathology.  - Gabapentin 600 mg 3 times daily - Ibuprofen 600 mg q6hr PRN - Tylenol 650 mg q6hrs PRN  #Rhabdomyolysis: Resolved.On admission CK was 8252, and downtrended to 92. Will not trend anymore.   #LUE Swelling #LUE Weakness:  LUE Doppler study negative for DVT. MRI of his cervical spine, L shoulder and chest were performed. MRI C-spine was notable for degenerative spinal stenosis and by for minimal impingement from C3-4, C6-7 and flattening of the spinal cord at C5-7.  This is likely causing his LUE weakness. MRI L shoulder notable for superior subscapularis partial tear and small glenohumeral joint effusion. MRI chest notable for rhabdo of chest and L side of the neck.  Neurology & Neurosurgery as seen the patient and reccomendations are below. - PT/OT recommend further therapy at [facility]   #FEN/GI - Diet: Regular - Fluids: none   #Code: Full   #Dispo: The patient is currently medically stable for discharge. Patient needs inpatient psych placement.   Earlene Plater, MD Internal Medicine, PGY1 Pager: [phone]  03/31/2019,10:11 AM

## 2019-03-31 NOTE — Progress Notes (Signed)
Occupational Therapy Treatment Patient Details Name: Clayton Baldwin MRN: [id number] DOB: 03/07/1971 Today's Date: 03/31/2019    History of present illness Patient is a 48 year old male with a history of HTN, Alcohol use disorder, Major depression, and hepatic steatosis who presented with EMS after being found down at his home four days after he overdosed on Lorazepam, trazodone and alcohol in a suicide attempt.   OT comments  Pt making progress towards OT goals. Pt supervision for LB dressing at EOB. Education provided on UB dressing technique d/t LUE peripheral nerve  injury. Pt verbalized understanding. Pt complete functional mobility with supervision. Pt unable to get bed on an inpatient psych unit- DC plan change to home today. Discussed home ADL routine with pt; pt reports no concerns with dc'ing back home. Will update OTR about updated DC plan. Will continue to follow acutely for OT.   Follow Up Recommendations  Supervision/Assistance - 24 hour    Equipment Recommendations  None recommended by OT    Recommendations for Other Services      Precautions / Restrictions Precautions Precautions: Fall Precaution Comments: Watch HR, L arm peripheral nerve injury Restrictions Weight Bearing Restrictions: No       Mobility Bed Mobility               General bed mobility comments: pt sitting EOB upon arrival  Transfers   Equipment used: 1 person hand held assist Transfers: Sit to/from Stand Sit to Stand: Supervision         General transfer comment: supervision for transfers; pt slightly impulsive and anxious to DC    Balance Overall balance assessment: Needs assistance Sitting-balance support: Feet supported Sitting balance-Leahy Scale: Good Sitting balance - Comments: able to sit EOB to don socks   Standing balance support: During functional activity Standing balance-Leahy Scale: Fair Standing balance comment: able to static stand with no external support                            ADL either performed or assessed with clinical judgement   ADL                     Upper Body Dressing Details (indicate cue type and reason): education on compensatory dressing method of donning affected extremity first; pt verbalized understanding Lower Body Dressing: Supervision/safety;Sit to/from stand Lower Body Dressing Details (indicate cue type and reason): able to don socks from EOB Toilet Transfer: Ambulation;Supervision/safety Toilet Transfer Details (indicate cue type and reason): simualted after functional mobility; supervision during functional mobility         Functional mobility during ADLs: Supervision/safety General ADL Comments: pt likely to DC home soon; declining further ADLS; functional mobility supervision for safety     Vision Patient Visual Report: No change from baseline     Perception     Praxis      Cognition Arousal/Alertness: Awake/alert Behavior During Therapy: Flat affect Overall Cognitive Status: Impaired/Different from baseline Area of Impairment: Safety/judgement;Following commands;Problem solving                       Following Commands: Follows one step commands consistently;Follows multi-step commands with increased time Safety/Judgement: Decreased awareness of deficits   Problem Solving: Slow processing;Decreased initiation;Requires verbal cues General Comments: pt slow to initiate; pleasant, but flat        Exercises     Shoulder Instructions       General Comments  Pertinent Vitals/ Pain       Pain Assessment: No/denies pain  Home Living                                          Prior Functioning/Environment              Frequency  Min 2X/week        Progress Toward Goals  OT Goals(current goals can now be found in the care plan section)  Progress towards OT goals: Progressing toward goals     Plan Discharge plan remains appropriate     Co-evaluation                 AM-PAC OT "6 Clicks" Daily Activity     Outcome Measure   Help from another person eating meals?: A Little Help from another person taking care of personal grooming?: A Little Help from another person toileting, which includes using toliet, bedpan, or urinal?: A Lot Help from another person bathing (including washing, rinsing, drying)?: A Lot Help from another person to put on and taking off regular upper body clothing?: A Little Help from another person to put on and taking off regular lower body clothing?: A Lot 6 Click Score: 15    End of Session    OT Visit Diagnosis: Muscle weakness (generalized) (M62.81);History of falling (Z91.81);Unsteadiness on feet (R26.81)   Activity Tolerance Patient tolerated treatment well   Patient Left in bed;with call bell/phone within reach;with nursing/sitter in room   Nurse Communication Mobility status        Time: [id number] OT Time Calculation (min): 11 min  Charges: OT General Charges $OT Visit: 1 Visit OT Treatments $Self Care/Home Management : 8-22 mins  Pollyann GlenMary Kate Clear [place], COTA/L Acute Rehabilitation Services [phone]    Angelina PihMary K Annaclaire Walsworth 03/31/2019, 3:54 PM

## 2019-03-31 NOTE — Plan of Care (Signed)
  Problem: Clinical Measurements: Goal: Diagnostic test results will improve Outcome: Progressing Goal: Respiratory complications will improve Outcome: Progressing   Problem: Pain Managment: Goal: General experience of comfort will improve Outcome: Progressing   Problem: Elimination: Goal: Will not experience complications related to urinary retention Outcome: Not Progressing

## 2019-03-31 NOTE — Consult Note (Signed)
Telepsych Consultation   Reason for Consult: "Patients affect improving. Nursing staff requesting additional consult to determine appropriateness for inpatient psych. Thank you for the consult." Referring Physician:  Dr. Aldine Contes Location of Patient: [id number] Location of Provider: [facility]  Patient Identification: Clayton Baldwin MRN:  [id number] Principal Diagnosis: MDD (major depressive disorder), recurrent episode, moderate ([place]) Diagnosis:  Principal Problem:   Severe episode of recurrent major depressive disorder, without psychotic features ([place]) Active Problems:   Rhabdomyolysis   Suicide attempt ([place])   Thrombocytopenia ([place])   AKI (acute kidney injury) ([place])   Hematoma of eyelid   Alcohol use disorder, severe, dependence ([place])   Subconjunctival hemorrhage, traumatic, right   Total Time spent with patient: 15 minutes  Subjective:   Clayton Baldwin is a 48 y.o. male patient admitted with rhabdomyolysis secondary to a suicide attempt by drug overdose.  HPI:   Per chart review, patient was admitted with on 9/4 with rhabdomyolysis secondary to a suicide attempt by drug overdose. He was found down at his home four days after he overdosed on Ativan and Trazodone in the setting of alcohol use. He has been depressed since he lost his job a few years ago. He was last seen by the psychiatry consult service on 9/10 for follow up. He was recommended for inpatient psychiatric hospitalization.   On interview, Clayton Baldwin reports that he is doing fine. He reports that his mood has improved with Lexapro. He denies feeling depressed today and reports that he was "moderately depressed" on admission. He denies problems with anxiety. He denies SI since admission. He denies HI or AVH. He reports fair sleep due to disturbances throughout the night in the hospital. He denies problems with appetite. He denies cravings for alcohol. He has access to weapons at home. He provides  verbal consent to speak to his parents, Roderic Palau and Broc Caspers [phone]).   Patient's mother reports that she does not have concerns for his safety. She believes that his mood declined after he received a DUI. He was very isolated due to not having transportation and the current pandemic. She reports that he does well when he is not using alcohol. She agrees that it would be beneficial for him to receive substance abuse treatment resources. She reports that they visited his home a few days ago and secured his weapon. His father has the key to the safe in his possession.   Past Psychiatric History: MDD and alcohol use disorder  Risk to Self:  Low. Denies SI.  Risk to Others:  None. Denies HI.  Prior Inpatient Therapy:   Denies  Prior Outpatient Therapy: He is followed by his PCP, Dr. Gwyndolyn Saxon   Past Medical History:  Past Medical History:  Diagnosis Date  . Depression   . Hypertension   . Insomnia     Past Surgical History:  Procedure Laterality Date  . APPENDECTOMY     Family History:  Family History  Problem Relation Age of Onset  . Liver disease Other   . Colon cancer Neg Hx   . Colon polyps Neg Hx    Family Psychiatric  History: Denies  Social History:  Social History   Substance and Sexual Activity  Alcohol Use Yes  . Alcohol/week: 0.0 standard drinks   Comment: couple of beers about 4 times per week      Social History   Substance and Sexual Activity  Drug Use No    Social History   Socioeconomic History  .  Marital status: Single    Spouse name: Not on file  . Number of children: Not on file  . Years of education: Not on file  . Highest education level: Not on file  Occupational History  . Occupation: unemployed  Social Needs  . Financial resource strain: Not on file  . Food insecurity    Worry: Not on file    Inability: Not on file  . Transportation needs    Medical: Not on file    Non-medical: Not on file  Tobacco Use  . Smoking status:  Never Smoker  . Smokeless tobacco: Never Used  . Tobacco comment: Never really smoked  Substance and Sexual Activity  . Alcohol use: Yes    Alcohol/week: 0.0 standard drinks    Comment: couple of beers about 4 times per week   . Drug use: No  . Sexual activity: Not on file  Lifestyle  . Physical activity    Days per week: Not on file    Minutes per session: Not on file  . Stress: Not on file  Relationships  . Social Musician on phone: Not on file    Gets together: Not on file    Attends religious service: Not on file    Active member of club or organization: Not on file    Attends meetings of clubs or organizations: Not on file    Relationship status: Not on file  Other Topics Concern  . Not on file  Social History Narrative  . Not on file   Additional Social History: He lives alone. He reports drinking 2-3 beers daily.     Allergies:   Allergies  Allergen Reactions  . Grass Extracts [Gramineae Pollens] Itching and Other (See Comments)    Wheezing, also    Labs:  Results for orders placed or performed during the hospital encounter of 03/17/19 (from the past 48 hour(s))  Glucose, capillary     Status: Abnormal   Collection Time: 03/30/19  6:33 AM  Result Value Ref Range   Glucose-Capillary 137 (H) 70 - 99 mg/dL    Medications:  Current Facility-Administered Medications  Medication Dose Route Frequency Provider Last Rate Last Dose  . acetaminophen (TYLENOL) tablet 650 mg  650 mg Oral Q6H PRN Seawell, Jaimie A, DO   650 mg at 03/30/19 1153  . amLODipine (NORVASC) tablet 10 mg  10 mg Oral Daily Kirt Boys, MD   10 mg at 03/30/19 1747  . calcium carbonate (TUMS - dosed in mg elemental calcium) chewable tablet 400 mg of elemental calcium  2 tablet Oral TID WC PRN Katherine Roan, MD   400 mg of elemental calcium at 03/19/19 1334  . docusate sodium (COLACE) capsule 100 mg  100 mg Oral BID Katherine Roan, MD   100 mg at 03/31/19 0831  . escitalopram  (LEXAPRO) tablet 10 mg  10 mg Oral Daily Kirt Boys, MD   10 mg at 03/31/19 0829  . folic acid (FOLVITE) tablet 1 mg  1 mg Oral Daily Seawell, Jaimie A, DO   1 mg at 03/31/19 0828  . gabapentin (NEURONTIN) capsule 600 mg  600 mg Oral TID Kirt Boys, MD   600 mg at 03/31/19 0829  . ibuprofen (ADVIL) tablet 600 mg  600 mg Oral Q6H PRN Kirt Boys, MD   600 mg at 03/28/19 1826  . labetalol (NORMODYNE) injection 5 mg  5 mg Intravenous Once PRN Seawell, Jaimie A, DO      .  lisinopril (ZESTRIL) tablet 5 mg  5 mg Oral Daily Kirt BoysAlexander, Andrew, MD   5 mg at 03/30/19 1747  . multivitamin with minerals tablet 1 tablet  1 tablet Oral Daily Seawell, Jaimie A, DO   1 tablet at 03/31/19 0830  . pneumococcal 23 valent vaccine (PNU-IMMUNE) injection 0.5 mL  0.5 mL Intramuscular Once Tyson AliasVincent, Duncan Thomas, MD      . polyethylene glycol [facility](MIRALAX / Ethelene HalGLYCOLAX) packet 17 g  17 g Oral BID PRN Kirt BoysAlexander, Andrew, MD      . polyvinyl alcohol (LIQUIFILM TEARS) 1.4 % ophthalmic solution 2 drop  2 drop Both Eyes PRN Kirt BoysAlexander, Andrew, MD      . senna [facility](SENOKOT) tablet 8.6 mg  1 tablet Oral Daily PRN Kirt BoysAlexander, Andrew, MD      . thiamine (VITAMIN B-1) tablet 100 mg  100 mg Oral Daily Seawell, Jaimie A, DO   100 mg at 03/31/19 [id number]   Or  . thiamine (B-1) injection 100 mg  100 mg Intravenous Daily Seawell, Jaimie A, DO      . vitamin B-12 (CYANOCOBALAMIN) tablet 1,000 mcg  1,000 mcg Oral Daily Kirt BoysAlexander, Andrew, MD   1,000 mcg at 03/31/19 [id number]    Musculoskeletal: Strength & Muscle Tone: No atrophy noted. Gait & Station: UTA since patient is sitting in a chair. Patient leans: N/A  Psychiatric Specialty Exam: Physical Exam  Nursing note and vitals reviewed. Constitutional: He is oriented to person, place, and time. He appears well-developed and well-nourished.  HENT:  Head: Normocephalic and atraumatic.  Neck: Normal range of motion.  Respiratory: Effort normal.  Musculoskeletal: Normal range of  motion.  Neurological: He is alert and oriented to person, place, and time.  Psychiatric: He has a normal mood and affect. His speech is normal and behavior is normal. Judgment and thought content normal. Cognition and memory are normal.    Review of Systems  Cardiovascular: Negative for chest pain.  Gastrointestinal: Positive for constipation. Negative for abdominal pain, diarrhea, nausea and vomiting.  Psychiatric/Behavioral: Positive for substance abuse. Negative for depression, hallucinations and suicidal ideas. The patient is not nervous/anxious and does not have insomnia.   All other systems reviewed and are negative.   Blood pressure 117/79, pulse 90, temperature 99 F (37.2 C), temperature source Oral, resp. rate 18, height 6' (1.829 m), weight 88.4 kg, SpO2 98 %.Body mass index is 26.43 kg/m.  General Appearance: Fairly Groomed, middle aged, Caucasian male, wearing a hospital gown with unshaved face who is sitting in a chair. NAD.   Eye Contact:  Good  Speech:  Clear and Coherent and Normal Rate  Volume:  Normal  Mood:  Euthymic  Affect:  Appropriate and Congruent  Thought Process:  Goal Directed, Linear and Descriptions of Associations: Intact  Orientation:  Full (Time, Place, and Person)  Thought Content:  Logical  Suicidal Thoughts:  No  Homicidal Thoughts:  No  Memory:  Immediate;   Good Recent;   Good Remote;   Good  Judgement:  Fair  Insight:  Fair  Psychomotor Activity:  Normal  Concentration:  Concentration: Good and Attention Span: Good  Recall:  Good  Fund of Knowledge:  Good  Language:  Good  Akathisia:  No  Handed:  Right  AIMS (if indicated):   N/A  Assets:  Communication Skills Desire for Improvement Housing Social Support  ADL's:  Intact  Cognition:  WNL  Sleep:    Good     Treatment Plan Summary: Clayton RalphsBenjamin Lankford is a 48 y.o.  male who was admitted on 9/4 with rhabdomyolysis secondary to a suicide attempt by drug overdose. Patient reports a  euthymic mood with congruent affect. He appears significantly improved since he was last seen on 9/10 by psychiatry. He denies SI, HI or AVH. He does not appear to be responding to internal stimuli. His mother was contacted. She denies concerns for his safety and reports that his weapon is secured at home. His mother reports that his family will assist with outpatient follow up and regularly check on patient. Recommend SW provide patient with resources for outpatient psychiatry and substance abuse treatment resources. Patient no longer warrants inpatient psychiatric hospitalization.    Recommendations.  -Continue Lexapro 10 mg daily for depression. -EKG reviewed and QTc 485 on 9/4. Please closely monitor when starting or increasing QTc prolonging agents.   -Please have SW provide patient with resources for outpatient psychiatrists and substance abuse treatment.  -Will sign off on patient at this time. Please consult psychiatry again as needed.     Disposition: No evidence of imminent risk to self or others at present.    This service was provided via telemedicine using a 2-way, interactive audio and video technology.  Names of all persons participating in this telemedicine service and their role in this encounter. Name: Clayton Baldwin Role: Patient  Name: Juanetta BeetsJacqueline Avik Leoni, DO Role: Psychiatrist    Cherly BeachJacqueline J Quindell Shere, DO 03/31/2019 12:33 PM

## 2019-04-06 ENCOUNTER — Ambulatory Visit: Payer: Self-pay

## 2019-04-11 ENCOUNTER — Ambulatory Visit: Payer: Self-pay

## 2019-04-18 ENCOUNTER — Inpatient Hospital Stay (INDEPENDENT_AMBULATORY_CARE_PROVIDER_SITE_OTHER): Payer: Self-pay | Admitting: Primary Care

## 2019-05-14 DEATH — deceased

## 2020-08-19 IMAGING — MR MR CHEST MEDIASTINUM WO/W CM
10 series · 16 of 16 positions shown · IV contrast (gadavist)
Comparison: MRI of the left shoulder dated 03/21/2019 and CT scan
of the chest dated 03/17/2019

CLINICAL DATA: Rhabdomyolysis after overdose with progressive left
arm weakness and numbness. Swelling of the left forearm.

EXAM:
MRI CHEST WITHOUT AND WITH CONTRAST
TECHNIQUE: Multiplanar multi echo sequences were obtained before and after
contrast.
CONTRAST:  9mL GADAVIST GADOBUTROL 1 MMOL/ML IV SOLN

[Series 2: T1 · axial · 3.0mm · 1.33mm/px · 1 of 49 slices shown (1 of 3)]
[im 1/49]
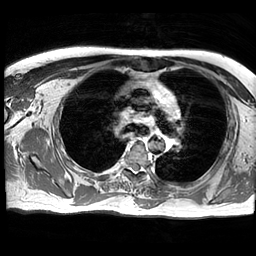

[Series 3: T2 fat-sat · axial · 3.0mm · 0.66mm/px · z∈[-26,+164]mm · 2 of 49 slices shown]
[im 1/49]
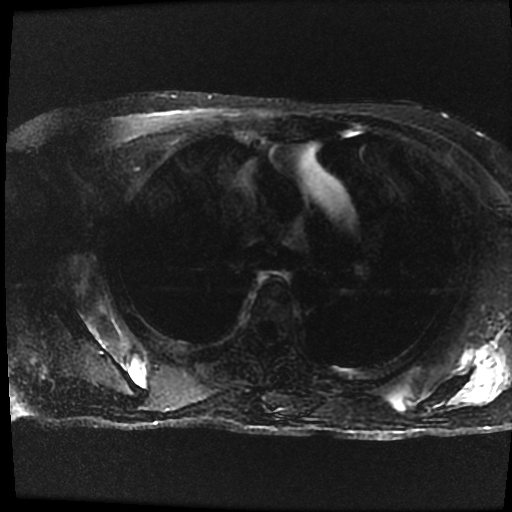
[im 49/49]
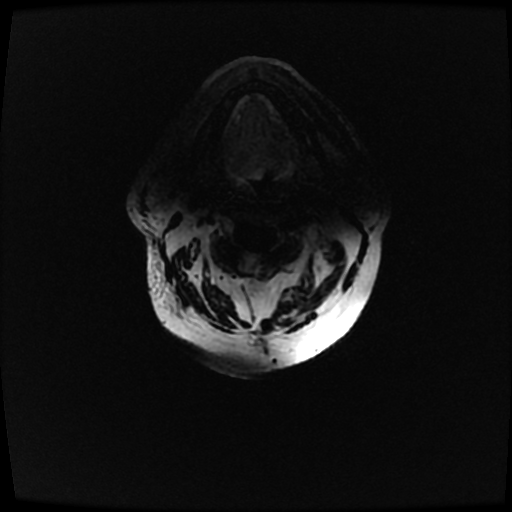

[Series 4: STIR · coronal · 3.0mm · 0.47mm/px · 1 of 38 slices shown]
[im 1/38]
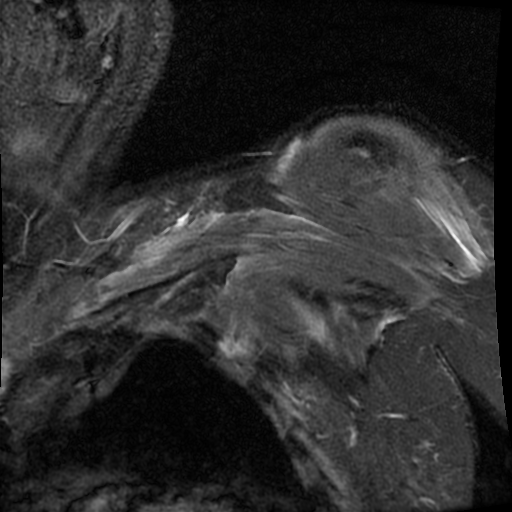

[Series 5: T1 · coronal · 3.0mm · 0.47mm/px · 1 of 38 slices shown (2 of 3)]
[im 1/38]
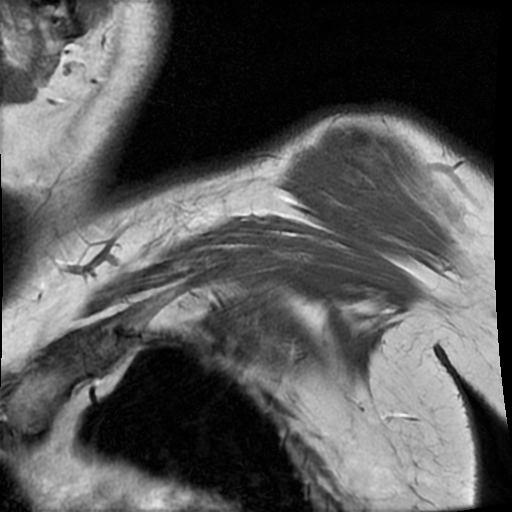

[Series 6: T2 · sagittal · 3.0mm · 0.47mm/px · 2 of 62 slices shown]
[im 1/62]
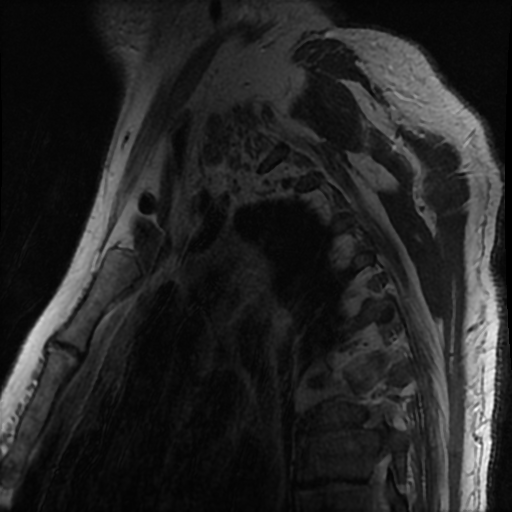
[im 62/62]
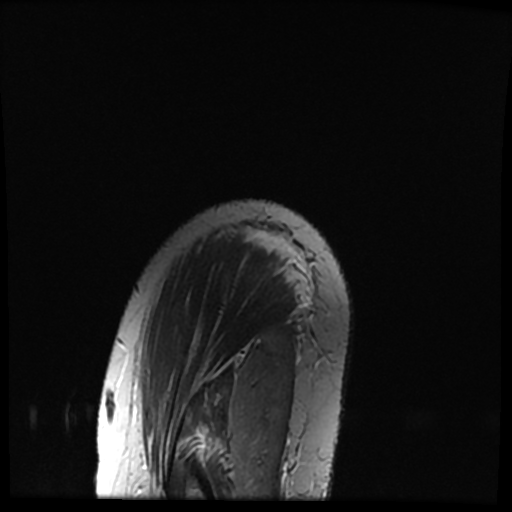

[Series 7: T1 · sagittal · 3.0mm · 0.94mm/px · 2 of 62 slices shown (3 of 3)]
[im 1/62]
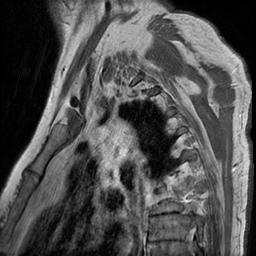
[im 62/62]
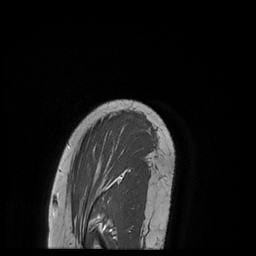

[Series 8: T1 fat-sat · axial · non-contrast · 3.0mm · 1.33mm/px · z∈[-26,+164]mm · 2 of 49 slices shown]
[im 1/49]
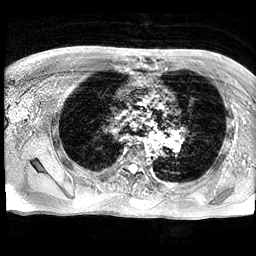
[im 49/49]
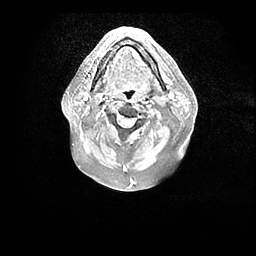

[Series 9: T1 fat-sat post-contrast · axial · 3.0mm · 1.33mm/px · z∈[-26,+164]mm · 2 of 49 slices shown (1 of 3)]
[im 1/49]
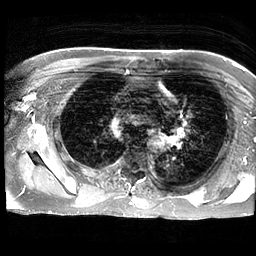
[im 49/49]
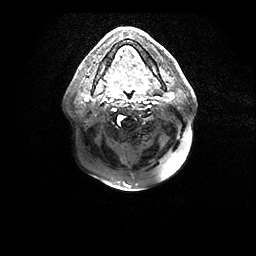

[Series 10: T1 fat-sat post-contrast · coronal · 3.0mm · 0.94mm/px · 1 of 38 slices shown (2 of 3)]
[im 1/38]
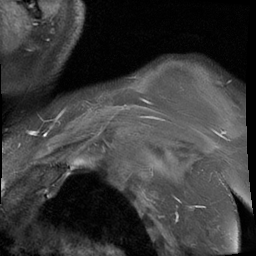

[Series 11: T1 fat-sat post-contrast · sagittal · 3.0mm · 0.94mm/px · 2 of 62 slices shown (3 of 3)]
[im 1/62]
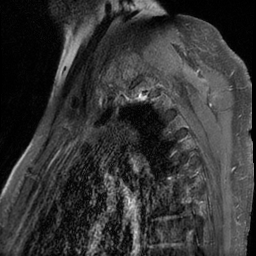
[im 62/62]
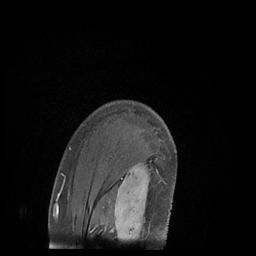

[16 of 16 positions shown; findings below may reference images not displayed]

FINDINGS: The patient has extensive areas of abnormal edema and abnormal
enhancement of muscles in the neck and chest including the right
supraspinatus and subscapularis muscles, the right pectoralis
muscles, the left rhomboid muscles the left subscapularis and left
deltoid muscles and to a lesser degree the infraspinatus,
supraspinatus, and teres minor muscles and left latissimus dorsi
muscle. There is abnormal enhancement of the left splenius capitis
muscle.
IMPRESSION: Extensive rhabdomyolysis of the chest and of the left side of the
neck as described above.
